# Patient Record
Sex: Female | Born: 1980 | Race: Black or African American | Hispanic: No | Marital: Single | Smoking: Former smoker
Health system: Southern US, Community
[De-identification: ages and names within clinical notes are randomized; demographics above are authoritative.]

## PROBLEM LIST (undated history)

## (undated) DIAGNOSIS — D509 Iron deficiency anemia, unspecified: Secondary | ICD-10-CM

## (undated) DIAGNOSIS — N979 Female infertility, unspecified: Secondary | ICD-10-CM

## (undated) DIAGNOSIS — D473 Essential (hemorrhagic) thrombocythemia: Secondary | ICD-10-CM

## (undated) DIAGNOSIS — N946 Dysmenorrhea, unspecified: Secondary | ICD-10-CM

## (undated) DIAGNOSIS — D75839 Thrombocytosis, unspecified: Secondary | ICD-10-CM

## (undated) HISTORY — DX: Thrombocytosis, unspecified: D75.839

## (undated) HISTORY — DX: Iron deficiency anemia, unspecified: D50.9

## (undated) HISTORY — DX: Essential (hemorrhagic) thrombocythemia: D47.3

## (undated) HISTORY — DX: Dysmenorrhea, unspecified: N94.6

## (undated) HISTORY — DX: Female infertility, unspecified: N97.9

---
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
## 2002-05-17 ENCOUNTER — Emergency Department (HOSPITAL_COMMUNITY): Admission: EM | Admit: 2002-05-17 | Discharge: 2002-05-17 | Payer: Self-pay | Admitting: Emergency Medicine

## 2002-09-07 ENCOUNTER — Encounter: Payer: Self-pay | Admitting: *Deleted

## 2002-09-07 ENCOUNTER — Emergency Department (HOSPITAL_COMMUNITY): Admission: AD | Admit: 2002-09-07 | Discharge: 2002-09-07 | Payer: Self-pay | Admitting: Emergency Medicine

## 2003-02-11 ENCOUNTER — Emergency Department (HOSPITAL_COMMUNITY): Admission: EM | Admit: 2003-02-11 | Discharge: 2003-02-11 | Payer: Self-pay | Admitting: Emergency Medicine

## 2003-04-28 ENCOUNTER — Emergency Department (HOSPITAL_COMMUNITY): Admission: EM | Admit: 2003-04-28 | Discharge: 2003-04-28 | Payer: Self-pay

## 2003-05-02 ENCOUNTER — Emergency Department (HOSPITAL_COMMUNITY): Admission: AD | Admit: 2003-05-02 | Discharge: 2003-05-02 | Payer: Self-pay | Admitting: Family Medicine

## 2004-07-26 ENCOUNTER — Inpatient Hospital Stay (HOSPITAL_COMMUNITY): Admission: AD | Admit: 2004-07-26 | Discharge: 2004-07-26 | Payer: Self-pay | Admitting: Obstetrics and Gynecology

## 2005-01-01 ENCOUNTER — Emergency Department (HOSPITAL_COMMUNITY): Admission: EM | Admit: 2005-01-01 | Discharge: 2005-01-02 | Payer: Self-pay | Admitting: Emergency Medicine

## 2005-10-01 ENCOUNTER — Emergency Department (HOSPITAL_COMMUNITY): Admission: EM | Admit: 2005-10-01 | Discharge: 2005-10-01 | Payer: Self-pay | Admitting: Emergency Medicine

## 2006-02-26 ENCOUNTER — Emergency Department (HOSPITAL_COMMUNITY): Admission: EM | Admit: 2006-02-26 | Discharge: 2006-02-26 | Payer: Self-pay | Admitting: Emergency Medicine

## 2008-02-12 ENCOUNTER — Emergency Department (HOSPITAL_COMMUNITY): Admission: EM | Admit: 2008-02-12 | Discharge: 2008-02-12 | Payer: Self-pay | Admitting: Family Medicine

## 2008-05-22 ENCOUNTER — Emergency Department (HOSPITAL_COMMUNITY): Admission: EM | Admit: 2008-05-22 | Discharge: 2008-05-22 | Payer: Self-pay | Admitting: Emergency Medicine

## 2008-06-25 ENCOUNTER — Emergency Department (HOSPITAL_COMMUNITY): Admission: EM | Admit: 2008-06-25 | Discharge: 2008-06-25 | Payer: Self-pay | Admitting: Emergency Medicine

## 2009-01-31 ENCOUNTER — Emergency Department (HOSPITAL_COMMUNITY): Admission: EM | Admit: 2009-01-31 | Discharge: 2009-01-31 | Payer: Self-pay | Admitting: Emergency Medicine

## 2009-08-12 ENCOUNTER — Emergency Department (HOSPITAL_COMMUNITY): Admission: EM | Admit: 2009-08-12 | Discharge: 2009-08-12 | Payer: Self-pay | Admitting: Emergency Medicine

## 2010-08-13 LAB — URINE CULTURE: Colony Count: 9000

## 2010-08-13 LAB — URINALYSIS, ROUTINE W REFLEX MICROSCOPIC
Bilirubin Urine: NEGATIVE
Glucose, UA: NEGATIVE mg/dL
Hgb urine dipstick: NEGATIVE
Ketones, ur: NEGATIVE mg/dL
Nitrite: NEGATIVE
Protein, ur: NEGATIVE mg/dL
Specific Gravity, Urine: 1.026 (ref 1.005–1.030)
Urobilinogen, UA: 1 mg/dL (ref 0.0–1.0)
pH: 8.5 — ABNORMAL HIGH (ref 5.0–8.0)

## 2010-08-13 LAB — URINE MICROSCOPIC-ADD ON

## 2010-08-13 LAB — POCT I-STAT, CHEM 8
BUN: 11 mg/dL (ref 6–23)
Calcium, Ion: 1.22 mmol/L (ref 1.12–1.32)
Chloride: 107 mEq/L (ref 96–112)
Creatinine, Ser: 0.6 mg/dL (ref 0.4–1.2)
Glucose, Bld: 113 mg/dL — ABNORMAL HIGH (ref 70–99)
HCT: 31 % — ABNORMAL LOW (ref 36.0–46.0)
Hemoglobin: 10.5 g/dL — ABNORMAL LOW (ref 12.0–15.0)
Potassium: 4 mEq/L (ref 3.5–5.1)
Sodium: 139 mEq/L (ref 135–145)
TCO2: 23 mmol/L (ref 0–100)

## 2010-08-13 LAB — POCT PREGNANCY, URINE: Preg Test, Ur: NEGATIVE

## 2010-08-13 LAB — CK: Total CK: 119 U/L (ref 7–177)

## 2010-08-25 LAB — URINALYSIS, ROUTINE W REFLEX MICROSCOPIC
Bilirubin Urine: NEGATIVE
Glucose, UA: NEGATIVE mg/dL
Hgb urine dipstick: NEGATIVE
Ketones, ur: NEGATIVE mg/dL
Nitrite: NEGATIVE
Protein, ur: NEGATIVE mg/dL
Specific Gravity, Urine: 1.026 (ref 1.005–1.030)
Urobilinogen, UA: 1 mg/dL (ref 0.0–1.0)
pH: 6.5 (ref 5.0–8.0)

## 2010-08-25 LAB — BASIC METABOLIC PANEL
BUN: 9 mg/dL (ref 6–23)
CO2: 23 mEq/L (ref 19–32)
Calcium: 9.4 mg/dL (ref 8.4–10.5)
Chloride: 105 mEq/L (ref 96–112)
Creatinine, Ser: 0.59 mg/dL (ref 0.4–1.2)
GFR calc Af Amer: >60 mL/min (ref >60)
GFR calc non Af Amer: >60 mL/min (ref >60)
Glucose, Bld: 84 mg/dL (ref 70–99)
Potassium: 3.7 mEq/L (ref 3.5–5.1)
Sodium: 135 mEq/L (ref 135–145)

## 2010-08-25 LAB — IRON AND TIBC
Iron: 16 ug/dL — ABNORMAL LOW (ref 42–135)
Saturation Ratios: 3 % — ABNORMAL LOW (ref 20–55)
TIBC: 463 ug/dL (ref 250–470)
UIBC: 447 ug/dL

## 2010-08-25 LAB — CBC
HCT: 25.8 % — ABNORMAL LOW (ref 36.0–46.0)
Hemoglobin: 7.8 g/dL — CL (ref 12.0–15.0)
MCHC: 30.4 g/dL (ref 30.0–36.0)
MCV: 68.3 fL — ABNORMAL LOW (ref 78.0–100.0)
Platelets: 641 10*3/uL — ABNORMAL HIGH (ref 150–400)
RBC: 3.77 MIL/uL — ABNORMAL LOW (ref 3.87–5.11)
RDW: 23.6 % — ABNORMAL HIGH (ref 11.5–15.5)
WBC: 11.5 10*3/uL — ABNORMAL HIGH (ref 4.0–10.5)

## 2010-08-25 LAB — RAPID STREP SCREEN (MED CTR MEBANE ONLY): Streptococcus, Group A Screen (Direct): NEGATIVE

## 2010-08-25 LAB — URINE CULTURE: Colony Count: >=100000

## 2010-08-25 LAB — DIFFERENTIAL
Basophils Absolute: 0.1 10*3/uL (ref 0.0–0.1)
Basophils Relative: 1 % (ref 0–1)
Eosinophils Absolute: 0 10*3/uL (ref 0.0–0.7)
Eosinophils Relative: 0 % (ref 0–5)
Lymphocytes Relative: 17 % (ref 12–46)
Lymphs Abs: 2 10*3/uL (ref 0.7–4.0)
Monocytes Absolute: 0.5 10*3/uL (ref 0.1–1.0)
Monocytes Relative: 4 % (ref 3–12)
Neutro Abs: 8.9 10*3/uL — ABNORMAL HIGH (ref 1.7–7.7)
Neutrophils Relative %: 78 % — ABNORMAL HIGH (ref 43–77)

## 2010-08-25 LAB — RETICULOCYTES
RBC.: 4.01 MIL/uL (ref 3.87–5.11)
Retic Count, Absolute: 20.1 10*3/uL (ref 19.0–186.0)
Retic Ct Pct: 0.5 % (ref 0.4–3.1)

## 2010-08-25 LAB — URINE MICROSCOPIC-ADD ON

## 2010-08-25 LAB — FERRITIN: Ferritin: <1 ng/mL — ABNORMAL LOW (ref 10–291)

## 2010-08-25 LAB — FOLATE: Folate: 19.2 ng/mL

## 2010-08-25 LAB — VITAMIN B12: Vitamin B-12: 772 pg/mL (ref 211–911)

## 2012-03-06 ENCOUNTER — Telehealth: Payer: Self-pay | Admitting: Oncology

## 2012-03-06 NOTE — Telephone Encounter (Signed)
S/W pt in re NP appt 10/23 @ 3 w/ Dr. Gaylyn Rong D/T @ pt request.  Referring Dr. Chevis Pretty Dx-[facility] 7.2 plts @ 967 NP packet mailed

## 2012-03-07 ENCOUNTER — Telehealth: Payer: Self-pay | Admitting: Oncology

## 2012-03-07 ENCOUNTER — Encounter: Payer: Self-pay | Admitting: Oncology

## 2012-03-07 DIAGNOSIS — D509 Iron deficiency anemia, unspecified: Secondary | ICD-10-CM | POA: Insufficient documentation

## 2012-03-07 DIAGNOSIS — N946 Dysmenorrhea, unspecified: Secondary | ICD-10-CM | POA: Insufficient documentation

## 2012-03-07 DIAGNOSIS — D473 Essential (hemorrhagic) thrombocythemia: Secondary | ICD-10-CM | POA: Insufficient documentation

## 2012-03-07 NOTE — Telephone Encounter (Signed)
C/D 03/07/12 for appt 03/12/12

## 2012-03-12 ENCOUNTER — Other Ambulatory Visit: Payer: Self-pay | Admitting: Lab

## 2012-03-12 ENCOUNTER — Ambulatory Visit: Payer: Self-pay

## 2012-03-12 ENCOUNTER — Ambulatory Visit: Payer: Self-pay | Admitting: Oncology

## 2012-03-12 NOTE — Progress Notes (Signed)
No show.  Missed appointment letter sent encouraging pt to call to reschedule.

## 2012-04-05 ENCOUNTER — Emergency Department (HOSPITAL_COMMUNITY)
Admission: EM | Admit: 2012-04-05 | Discharge: 2012-04-06 | Disposition: A | Discharge status: Home / Self Care | Payer: BC Managed Care – PPO | Attending: Emergency Medicine | Admitting: Emergency Medicine

## 2012-04-05 ENCOUNTER — Encounter (HOSPITAL_COMMUNITY): Payer: Self-pay | Admitting: Emergency Medicine

## 2012-04-05 DIAGNOSIS — Z862 Personal history of diseases of the blood and blood-forming organs and certain disorders involving the immune mechanism: Secondary | ICD-10-CM | POA: Insufficient documentation

## 2012-04-05 DIAGNOSIS — Z87891 Personal history of nicotine dependence: Secondary | ICD-10-CM | POA: Insufficient documentation

## 2012-04-05 DIAGNOSIS — D649 Anemia, unspecified: Secondary | ICD-10-CM | POA: Insufficient documentation

## 2012-04-05 LAB — POCT I-STAT, CHEM 8
Chloride: 102 mEq/L (ref 96–112)
Creatinine, Ser: 0.8 mg/dL (ref 0.50–1.10)
Hemoglobin: 8.8 g/dL — ABNORMAL LOW (ref 12.0–15.0)
Potassium: 3.5 mEq/L (ref 3.5–5.1)
Sodium: 138 mEq/L (ref 135–145)

## 2012-04-05 NOTE — ED Provider Notes (Signed)
History     CSN: [id number]  Arrival date & time 04/05/12  2249   First MD Initiated Contact with Patient 04/05/12 2307      No chief complaint on file.   (Consider location/radiation/quality/duration/timing/severity/associated sxs/prior treatment) Patient is a 31 y.o. female presenting with musculoskeletal pain. The history is provided by the patient.  Muscle Pain Associated symptoms include weakness. Pertinent negatives include no chills, coughing or fever. Associated symptoms comments: She was seen [facility] yesterday for generalized body aches thinking she had the flu. She states that the flu test was negative. Urgent Care called her today and told her to come to the ED for evaluation of hemoglobin of 7. She reports history of anemia secondary to heavy periods on average of twice monthly. She has no history of requiring transfusion. .    Past Medical History  Diagnosis Date  . Dysmenorrhea   . 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 Iron deficiency anemia   . Thrombocytosis     History reviewed. No pertinent past surgical history.  History reviewed. No pertinent family history.  History  Substance Use Topics  . Smoking status: Former Smoker    Quit date: 03/05/2012  . Smokeless tobacco: Never Used  . Alcohol Use: Yes     Comment: occassionally    OB History    Grav Para Term Preterm Abortions TAB SAB Ect Mult Living                  Review of Systems  Constitutional: Negative for fever and chills.  Respiratory: Negative.  Negative for cough.   Cardiovascular: Negative.   Gastrointestinal: Negative.   Genitourinary: Negative.  Negative for dysuria.  Musculoskeletal: Negative.   Skin: Negative.   Neurological: Positive for weakness. Negative for dizziness and light-headedness.    Allergies  Review of patient's allergies indicates not on file.  Home Medications  No current outpatient prescriptions on file.  BP 101/73  Pulse 84  Temp 98.8 F (37.1 C)  (Oral)  Resp 14  SpO2 99%  LMP 03/21/2012  Physical Exam  Constitutional: She is oriented to person, place, and time. She appears well-developed and well-nourished.  HENT:  Head: Normocephalic.  Eyes:       Mild conjunctival pallor.  Neck: Normal range of motion. Neck supple.  Cardiovascular: Normal rate and regular rhythm.   Pulmonary/Chest: Effort normal and breath sounds normal.  Abdominal: Soft. Bowel sounds are normal. There is no tenderness. There is no rebound and no guarding.  Musculoskeletal: Normal range of motion.  Neurological: She is alert and oriented to person, place, and time.  Skin: Skin is warm and dry. No rash noted.  Psychiatric: She has a normal mood and affect.    ED Course  Procedures (including critical care time)  Labs Reviewed  POCT I-STAT, CHEM 8 - Abnormal; Notable for the following:    Hemoglobin 8.8 (*)     HCT 26.0 (*)     All other components within normal limits   No results found.   No diagnosis found.  1. Anemia   MDM  Hgb is 8.8 without orthostatic hypotension. Patient is stable for discharge and outpatient follow up with Dr. Chevis Pretty.         Rodena Medin, PA-C 04/06/12 0005

## 2012-04-05 NOTE — ED Notes (Signed)
Pt states she thought she had the flu so she went to [facility] yesterday. They gave her shot (pt is unsure of what they gave her). States she is still feeling weak. States her iron was a 7 at the [facility].

## 2012-04-05 NOTE — ED Notes (Signed)
Pt states she has felt weak and tired today. Pt states she has a hx of anemia. Pt states she was aching yesterday. Pt states she has a slight headache. Pt denies nausea.

## 2012-04-06 NOTE — ED Provider Notes (Signed)
Medical screening examination/treatment/procedure(s) were performed by non-physician practitioner and as supervising physician I was immediately available for consultation/collaboration.    Vida Roller, MD 04/06/12 [phone]

## 2012-05-02 ENCOUNTER — Telehealth: Payer: Self-pay | Admitting: Oncology

## 2012-05-02 NOTE — Telephone Encounter (Signed)
S/W PT IN REF TO NP APPT. ON 05/09/12 @3 :00 REFERRING DR Anselmo Rod MAILED NP PACKET

## 2012-05-02 NOTE — Telephone Encounter (Signed)
C/D 05/02/12 for appt. 05/09/12 °

## 2012-05-09 ENCOUNTER — Ambulatory Visit (HOSPITAL_BASED_OUTPATIENT_CLINIC_OR_DEPARTMENT_OTHER): Payer: BC Managed Care – PPO | Admitting: Oncology

## 2012-05-09 ENCOUNTER — Other Ambulatory Visit (HOSPITAL_BASED_OUTPATIENT_CLINIC_OR_DEPARTMENT_OTHER): Payer: BC Managed Care – PPO | Admitting: Lab

## 2012-05-09 ENCOUNTER — Encounter: Payer: Self-pay | Admitting: Oncology

## 2012-05-09 ENCOUNTER — Encounter (HOSPITAL_COMMUNITY)
Admission: RE | Admit: 2012-05-09 | Discharge: 2012-05-09 | Disposition: A | Discharge status: Home / Self Care | Payer: BC Managed Care – PPO | Source: Ambulatory Visit | Attending: Oncology | Admitting: Oncology

## 2012-05-09 ENCOUNTER — Ambulatory Visit: Payer: BC Managed Care – PPO

## 2012-05-09 VITALS — BP 119/78 | HR 83 | Temp 98.0°F | Resp 16 | Ht 61.0 in | Wt 107.0 lb

## 2012-05-09 DIAGNOSIS — D649 Anemia, unspecified: Secondary | ICD-10-CM

## 2012-05-09 DIAGNOSIS — D509 Iron deficiency anemia, unspecified: Secondary | ICD-10-CM

## 2012-05-09 DIAGNOSIS — N920 Excessive and frequent menstruation with regular cycle: Secondary | ICD-10-CM

## 2012-05-09 DIAGNOSIS — N946 Dysmenorrhea, unspecified: Secondary | ICD-10-CM

## 2012-05-09 DIAGNOSIS — D473 Essential (hemorrhagic) thrombocythemia: Secondary | ICD-10-CM

## 2012-05-09 LAB — MORPHOLOGY

## 2012-05-09 LAB — CBC WITH DIFFERENTIAL/PLATELET
Basophils Absolute: 0.1 10*3/uL (ref 0.0–0.1)
Eosinophils Absolute: 0.1 10*3/uL (ref 0.0–0.5)
HCT: 22 % — ABNORMAL LOW (ref 34.8–46.6)
HGB: 6.4 g/dL — CL (ref 11.6–15.9)
MCV: 64 fL — ABNORMAL LOW (ref 79.5–101.0)
MONO%: 6.8 % (ref 0.0–14.0)
NEUT#: 5.2 10*3/uL (ref 1.5–6.5)
RDW: 21.3 % — ABNORMAL HIGH (ref 11.2–14.5)
lymph#: 2 10*3/uL (ref 0.9–3.3)

## 2012-05-09 LAB — COMPREHENSIVE METABOLIC PANEL (CC13)
CO2: 24 mEq/L (ref 22–29)
Creatinine: 0.7 mg/dL (ref 0.6–1.1)
Glucose: 96 mg/dl (ref 70–99)
Total Bilirubin: 0.22 mg/dL (ref 0.20–1.20)

## 2012-05-09 LAB — RETICULOCYTES: Immature Retic Fract: 9.3 % (ref 1.60–10.00)

## 2012-05-09 NOTE — Patient Instructions (Addendum)
1.  Issue:  Severe anemia. 2.  Potential causes:  Iron deficiency (heavy period? Poor absorption from GI track?)   sickle cell/thalessemia; other vitamin deficiency (Vit B12?) 3.  Treatment:   *  Short term:  Blood transfusion:  2 units tomorrow Saturday 05/10/12 at 8am.  *  Long term:  If due to iron deficiency, I will recommend IV iron and oral iron.  I'll call you and let you know what to do.   Figure out cause of anemia and treat underlying causes.  4.  Follow up:  Blood test once a week until improved blood count.  Return visit in about 6 months.

## 2012-05-09 NOTE — Progress Notes (Signed)
[facility]  [phone] [phone]     INITIAL HEMATOLOGY CONSULTATION    Referral MD:  Dr. Dimas Aguas C. Mezer, M.D.  Reason for Referral: anemia.     HPI:  [name] is a 31 year-old [place] American woman with history of menometrorrhagia.  Her menstrual cycle lasts for about 7 days every 21 days.  The first 4 days were heavy when she needs to change pads every 2 hours with lots of clot.  She had history of iron deficiency in the past.  She could not tolerate oral contraceptives and oral iron due to nausea/vomiting, abdominal cramp.  She has not been on oral iron for many years.  For the past few weeks, she has been feeling week.  She has been feeling stress at work where she works as a Presenter, broadcasting at [employer].  She has been having low appetite from what she claimed stress from work.  She has about 10-lb nonintentional weight loss.  She presented to PCP with anemia.  She was kindly referred to the [facility] for evaluation.  [name] presented to the clinic by herself today.  She reported mild fatigue.  She has some numbness in her bilateral arms.  Beside menometrorrhagia, she denied any visible source of bleeding.  She denied dizziness, SOB, chest pain, syncope, skin rash, joint pain.  Patient denies fever, headache, visual changes, confusion, drenching night sweats, palpable lymph node swelling, mucositis, odynophagia, dysphagia, nausea vomiting, jaundice, chest pain, palpitation, shortness of breath, dyspnea on exertion, productive cough, gum bleeding, epistaxis, hematemesis, hemoptysis, abdominal pain, abdominal swelling, early satiety, melena, hematochezia, hematuria, skin rash, spontaneous bleeding, joint swelling, joint pain, heat or cold intolerance, bowel bladder incontinence, back pain, focal motor weakness, paresthesia, depression, suicidal or homicidal ideation, feeling hopelessness.     Past Medical History  Diagnosis Date    . Dysmenorrhea   . 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 Iron deficiency anemia   . Thrombocytosis   :    No past surgical history on file.:   CURRENT MEDS: No current outpatient prescriptions on file.      No Known Allergies:  No family history on file.:  History   Social History  . Marital Status: Single    Spouse Name: N/A    Number of Children: 0  . Years of Education: N/A   Occupational History  .      lifting,ware house.    Social History Main Topics  . Smoking status: Former Smoker    Quit date: 03/05/2012  . Smokeless tobacco: Never Used  . Alcohol Use: Yes     Comment: occassionally  . Drug Use: No  . Sexually Active: Not on file   Other Topics Concern  . Not on file   Social History Narrative  . No narrative on file  :  REVIEW OF SYSTEM:  The rest of the 14-point review of sytem was negative.   Exam: ECOG 0-1.   General:  Thin-appearing woman, in no acute distress.  Eyes:  no scleral icterus.  ENT:  There were no oropharyngeal lesions.  Neck was without thyromegaly.  Lymphatics:  Negative cervical, supraclavicular or axillary adenopathy.  Respiratory: lungs were clear bilaterally without wheezing or crackles.  Cardiovascular:  Regular rate and rhythm, S1/S2, without murmur, rub or gallop.  There was no pedal edema.  GI:  abdomen was soft, flat, nontender, nondistended, without organomegaly.  Despite my explanation of the need for  rectal exam to rule out anal/rectal mass and for heme occult Guaiac testing, she declined.  Muscoloskeletal:  no spinal tenderness of palpation of vertebral spine.  Skin exam was without echymosis, petichae.  Neuro exam was nonfocal.  Patient was able to get on and off exam table without assistance.  Gait was normal.  Patient was alerted and oriented.  Attention was good.   Language was appropriate.  Mood was normal without depression.  Speech was not pressured.  Thought content was not tangential.    LABS:  Lab Results   Component Value Date   WBC 7.8 05/09/2012   HGB 6.4* 05/09/2012   HCT 22.0* 05/09/2012   PLT 217 05/09/2012   GLUCOSE 96 05/09/2012   ALT <6 Repeated and Verified 05/09/2012   AST 14 05/09/2012   NA 136 05/09/2012   K 3.7 05/09/2012   CL 107 05/09/2012   CREATININE 0.7 05/09/2012   BUN 12.0 05/09/2012   CO2 24 05/09/2012    No results found.  Blood smear review:   I personally reviewed the patient's peripheral blood smear today.  There was anisocytosis.  There was no peripheral blast.  There were increased in central pallor, and cigar-shaped RBC.  There was no schistocytosis, spherocytosis, target cell, rouleaux formation, tear drop cell.  There was no giant platelets or platelet clumps.      ASSESSMENT AND PLAN:   1.  Menometrorrhagia:  I deferred to Dr. Corwin Levins expertise to see if she would benefit from other forms of birth control. 2.  Microcytic anemia. - Causes:  Iron deficiency due to menometrorrhagia.  I cannot rule out sickle cell/thalessemia; however, there is no family history of hemoglobinopathy.  I cannot test for these entities while she is severely iron deficiency.  Retic count and rest of LFT did not show sign of hemolysis or brisk bleeding.  - Treatment:   *  Short term:  Blood transfusion:  2 units tomorrow Saturday 05/10/12 at 8am.  *  Long term:  I will recommend IV iron and oral iron.  -  Follow up:  Blood test once a week until improved Hgb.  Return visit in about 6 months.  - If despite iron repletion and she is still anemic, I may consider further work up in the future with hemoglobinopathy testing, bone marrow biopsy, or GI referral.         The length of time of the face-to-face encounter was 45 minutes. More than 50% of time was spent counseling and coordination of care.     Thank you for this referral.

## 2012-05-10 ENCOUNTER — Ambulatory Visit (HOSPITAL_BASED_OUTPATIENT_CLINIC_OR_DEPARTMENT_OTHER): Payer: BC Managed Care – PPO

## 2012-05-10 VITALS — BP 125/76 | HR 76 | Temp 99.0°F | Resp 20

## 2012-05-10 DIAGNOSIS — D649 Anemia, unspecified: Secondary | ICD-10-CM

## 2012-05-10 LAB — IRON AND TIBC: Iron: <10 ug/dL — ABNORMAL LOW (ref 42–145)

## 2012-05-10 LAB — PREPARE RBC (CROSSMATCH)

## 2012-05-10 LAB — FERRITIN: Ferritin: <1 ng/mL — ABNORMAL LOW (ref 10–291)

## 2012-05-10 MED ORDER — DIPHENHYDRAMINE HCL 25 MG PO CAPS
25.0000 mg | ORAL_CAPSULE | Freq: Once | ORAL | Status: AC
  Administered 2012-05-10: 25 mg via ORAL

## 2012-05-10 MED ORDER — ACETAMINOPHEN 325 MG PO TABS
650.0000 mg | ORAL_TABLET | Freq: Once | ORAL | Status: AC
  Administered 2012-05-10: 650 mg via ORAL

## 2012-05-10 MED ORDER — SODIUM CHLORIDE 0.9 % IV SOLN: 250.0000 mL | Freq: Once | INTRAVENOUS | Status: DC

## 2012-05-10 NOTE — Patient Instructions (Addendum)
Blood Transfusion Information WHAT IS A BLOOD TRANSFUSION? A transfusion is the replacement of blood or some of its parts. Blood is made up of multiple cells which provide different functions.  Red blood cells carry oxygen and are used for blood loss replacement.  White blood cells fight against infection.  Platelets control bleeding.  Plasma helps clot blood.  Other blood products are available for specialized needs, such as hemophilia or other clotting disorders. BEFORE THE TRANSFUSION  Who gives blood for transfusions?   You may be able to donate blood to be used at a later date on yourself (autologous donation).  Relatives can be asked to donate blood. This is generally not any safer than if you have received blood from a stranger. The same precautions are taken to ensure safety when a relative's blood is donated.  Healthy volunteers who are fully evaluated to make sure their blood is safe. This is blood bank blood. Transfusion therapy is the safest it has ever been in the practice of medicine. Before blood is taken from a donor, a complete history is taken to make sure that person has no history of diseases nor engages in risky social behavior (examples are intravenous drug use or sexual activity with multiple partners). The donor's travel history is screened to minimize risk of transmitting infections, such as malaria. The donated blood is tested for signs of infectious diseases, such as HIV and hepatitis. The blood is then tested to be sure it is compatible with you in order to minimize the chance of a transfusion reaction. If you or a relative donates blood, this is often done in anticipation of surgery and is not appropriate for emergency situations. It takes many days to process the donated blood. RISKS AND COMPLICATIONS Although transfusion therapy is very safe and saves many lives, the main dangers of transfusion include:   Getting an infectious disease.  Developing a  transfusion reaction. This is an allergic reaction to something in the blood you were given. Every precaution is taken to prevent this. The decision to have a blood transfusion has been considered carefully by your caregiver before blood is given. Blood is not given unless the benefits outweigh the risks. AFTER THE TRANSFUSION  Right after receiving a blood transfusion, you will usually feel much better and more energetic. This is especially true if your red blood cells have gotten low (anemic). The transfusion raises the level of the red blood cells which carry oxygen, and this usually causes an energy increase.  The nurse administering the transfusion will monitor you carefully for complications. HOME CARE INSTRUCTIONS  No special instructions are needed after a transfusion. You may find your energy is better. Speak with your caregiver about any limitations on activity for underlying diseases you may have. SEEK MEDICAL CARE IF:   Your condition is not improving after your transfusion.  You develop redness or irritation at the intravenous (IV) site. SEEK IMMEDIATE MEDICAL CARE IF:  Any of the following symptoms occur over the next 12 hours:  Shaking chills.  You have a temperature by mouth above 102 F (38.9 C), not controlled by medicine.  Chest, back, or muscle pain.  People around you feel you are not acting correctly or are confused.  Shortness of breath or difficulty breathing.  Dizziness and fainting.  You get a rash or develop hives.  You have a decrease in urine output.  Your urine turns a dark color or changes to pink, red, or brown. Any of the following   symptoms occur over the next 10 days:  You have a temperature by mouth above 102 F (38.9 C), not controlled by medicine.  Shortness of breath.  Weakness after normal activity.  The white part of the eye turns yellow (jaundice).  You have a decrease in the amount of urine or are urinating less often.  Your  urine turns a dark color or changes to pink, red, or brown. Document Released: 05/04/2000 Document Revised: 07/30/2011 Document Reviewed: 12/22/2007 ExitCare Patient Information 2013 [employer].  

## 2012-05-11 LAB — TYPE AND SCREEN
Antibody Screen: NEGATIVE
Unit division: 0

## 2012-05-16 ENCOUNTER — Other Ambulatory Visit (HOSPITAL_BASED_OUTPATIENT_CLINIC_OR_DEPARTMENT_OTHER): Payer: BC Managed Care – PPO

## 2012-05-16 DIAGNOSIS — D509 Iron deficiency anemia, unspecified: Secondary | ICD-10-CM

## 2012-05-16 DIAGNOSIS — D649 Anemia, unspecified: Secondary | ICD-10-CM

## 2012-05-16 LAB — CBC WITH DIFFERENTIAL/PLATELET
Basophils Absolute: 0.1 10*3/uL (ref 0.0–0.1)
Eosinophils Absolute: 0.2 10*3/uL (ref 0.0–0.5)
HGB: 9.7 g/dL — ABNORMAL LOW (ref 11.6–15.9)
LYMPH%: 25.3 % (ref 14.0–49.7)
MCV: 71.7 fL — ABNORMAL LOW (ref 79.5–101.0)
MONO#: 0.6 10*3/uL (ref 0.1–0.9)
MONO%: 5.8 % (ref 0.0–14.0)
NEUT%: 66.2 % (ref 38.4–76.8)

## 2012-05-17 ENCOUNTER — Telehealth: Payer: Self-pay | Admitting: *Deleted

## 2012-05-17 NOTE — Telephone Encounter (Signed)
Per staff message and POF I have scheduled appt.  JMW  

## 2012-05-17 NOTE — Telephone Encounter (Signed)
Per staff message and POF I have scheduled appts.  JMW  

## 2012-05-19 ENCOUNTER — Telehealth: Payer: Self-pay | Admitting: Oncology

## 2012-05-19 NOTE — Telephone Encounter (Signed)
lvm for pt for 12.31.13 tx...Marland KitchenMarland Kitchen

## 2012-05-20 ENCOUNTER — Ambulatory Visit (HOSPITAL_BASED_OUTPATIENT_CLINIC_OR_DEPARTMENT_OTHER): Payer: BC Managed Care – PPO

## 2012-05-20 VITALS — BP 124/79 | HR 70 | Temp 98.5°F

## 2012-05-20 DIAGNOSIS — D649 Anemia, unspecified: Secondary | ICD-10-CM

## 2012-05-20 DIAGNOSIS — D509 Iron deficiency anemia, unspecified: Secondary | ICD-10-CM

## 2012-05-20 MED ORDER — FERUMOXYTOL INJECTION 510 MG/17 ML
1020.0000 mg | Freq: Once | INTRAVENOUS | Status: AC
  Administered 2012-05-20: 1020 mg via INTRAVENOUS
  Filled 2012-05-20: qty 34

## 2012-05-20 MED ORDER — SODIUM CHLORIDE 0.9 % IV SOLN
INTRAVENOUS | Status: DC
  Administered 2012-05-20: 16:00:00 via INTRAVENOUS

## 2012-05-20 NOTE — Patient Instructions (Signed)
Ferumoxytol injection What is this medicine? FERUMOXYTOL is an iron complex. Iron is used to make healthy red blood cells, which carry oxygen and nutrients throughout the body. This medicine is used to treat iron deficiency anemia in people with chronic kidney disease. This medicine may be used for other purposes; ask your health care provider or pharmacist if you have questions. What should I tell my health care provider before I take this medicine? They need to know if you have any of these conditions: -anemia not caused by low iron levels -high levels of iron in the blood -magnetic resonance imaging (MRI) test scheduled -an unusual or allergic reaction to iron, other medicines, foods, dyes, or preservatives -pregnant or trying to get pregnant -breast-feeding How should I use this medicine? This medicine is for infusion into a vein. It is given by a health care professional in a hospital or clinic setting. Talk to your pediatrician regarding the use of this medicine in children. Special care may be needed. Overdosage: If you think you've taken too much of this medicine contact a poison control center or emergency room at once. Overdosage: If you think you have taken too much of this medicine contact a poison control center or emergency room at once. NOTE: This medicine is only for you. Do not share this medicine with others. What if I miss a dose? It is important not to miss your dose. Call your doctor or health care professional if you are unable to keep an appointment. What may interact with this medicine? This medicine may interact with the following medications: -other iron products This list may not describe all possible interactions. Give your health care provider a list of all the medicines, herbs, non-prescription drugs, or dietary supplements you use. Also tell them if you smoke, drink alcohol, or use illegal drugs. Some items may interact with your medicine. What should I watch  for while using this medicine? Visit your doctor or healthcare professional regularly. Tell your doctor or healthcare professional if your symptoms do not start to get better or if they get worse. You may need blood work done while you are taking this medicine. You may need to follow a special diet. Talk to your doctor. Foods that contain iron include: whole grains/cereals, dried fruits, beans, or peas, leafy green vegetables, and organ meats (liver, kidney). What side effects may I notice from receiving this medicine? Side effects that you should report to your doctor or health care professional as soon as possible: -allergic reactions like skin rash, itching or hives, swelling of the face, lips, or tongue -breathing problems -changes in blood pressure -feeling faint or lightheaded, falls -fever or chills -flushing, sweating, or hot feelings -swelling of the ankles or feet Side effects that usually do not require medical attention (Report these to your doctor or health care professional if they continue or are bothersome.): -diarrhea -headache -nausea, vomiting -stomach pain This list may not describe all possible side effects. Call your doctor for medical advice about side effects. You may report side effects to FDA at 1-800-FDA-1088. Where should I keep my medicine? This drug is given in a hospital or clinic and will not be stored at home. NOTE: This sheet is a summary. It may not cover all possible information. If you have questions about this medicine, talk to your doctor, pharmacist, or health care provider.  2013, Elsevier/[employer] Standard. (01/28/2008 9:48:25 PM)  

## 2012-05-23 ENCOUNTER — Other Ambulatory Visit (HOSPITAL_BASED_OUTPATIENT_CLINIC_OR_DEPARTMENT_OTHER): Payer: BC Managed Care – PPO | Admitting: Lab

## 2012-05-23 ENCOUNTER — Telehealth: Payer: Self-pay | Admitting: *Deleted

## 2012-05-23 DIAGNOSIS — D509 Iron deficiency anemia, unspecified: Secondary | ICD-10-CM

## 2012-05-23 DIAGNOSIS — D649 Anemia, unspecified: Secondary | ICD-10-CM

## 2012-05-23 LAB — CBC WITH DIFFERENTIAL/PLATELET
BASO%: 1.3 % (ref 0.0–2.0)
HCT: 31.4 % — ABNORMAL LOW (ref 34.8–46.6)
LYMPH%: 21.8 % (ref 14.0–49.7)
MCH: 22.4 pg — ABNORMAL LOW (ref 25.1–34.0)
MCHC: 31.4 g/dL — ABNORMAL LOW (ref 31.5–36.0)
MCV: 71.3 fL — ABNORMAL LOW (ref 79.5–101.0)
MONO#: 0.7 10*3/uL (ref 0.1–0.9)
MONO%: 6.2 % (ref 0.0–14.0)
NEUT%: 70 % (ref 38.4–76.8)
Platelets: 513 10*3/uL — ABNORMAL HIGH (ref 145–400)
RBC: 4.4 10*6/uL (ref 3.70–5.45)
WBC: 11.9 10*3/uL — ABNORMAL HIGH (ref 3.9–10.3)

## 2012-05-23 NOTE — Telephone Encounter (Signed)
Left VM for pt to return nurse's call.  

## 2012-05-23 NOTE — Telephone Encounter (Signed)
Message copied by Wende Mott on Fri May 23, 2012  4:36 PM ------      Message from: Jethro Bolus T      Created: Fri May 23, 2012  4:23 PM       Please call pt.  Hgb is 9.8.  We can check CBC every 2 weeks now.  She received IV iron 05/20/12.  Hopefully, this will kick in by the end of Jan 2014 to improve her Hgb much more.  Thanks.

## 2012-05-26 ENCOUNTER — Telehealth: Payer: Self-pay | Admitting: Oncology

## 2012-05-26 NOTE — Telephone Encounter (Signed)
s.w. pt and advised on change in schedule and to come by on nxt visit to get updated schedule....pt aware and ok

## 2012-05-30 ENCOUNTER — Other Ambulatory Visit: Payer: BC Managed Care – PPO

## 2012-06-06 ENCOUNTER — Other Ambulatory Visit: Payer: BC Managed Care – PPO | Admitting: Lab

## 2012-06-13 ENCOUNTER — Other Ambulatory Visit: Payer: BC Managed Care – PPO

## 2012-06-20 ENCOUNTER — Other Ambulatory Visit: Payer: BC Managed Care – PPO | Admitting: Lab

## 2012-06-20 ENCOUNTER — Other Ambulatory Visit: Payer: BC Managed Care – PPO

## 2012-06-27 ENCOUNTER — Other Ambulatory Visit: Payer: BC Managed Care – PPO

## 2012-07-04 ENCOUNTER — Other Ambulatory Visit: Payer: BC Managed Care – PPO | Admitting: Lab

## 2012-07-04 ENCOUNTER — Other Ambulatory Visit: Payer: BC Managed Care – PPO

## 2012-07-11 ENCOUNTER — Other Ambulatory Visit: Payer: BC Managed Care – PPO

## 2012-07-18 ENCOUNTER — Other Ambulatory Visit: Payer: BC Managed Care – PPO

## 2012-07-25 ENCOUNTER — Other Ambulatory Visit: Payer: BC Managed Care – PPO

## 2012-10-20 ENCOUNTER — Telehealth: Payer: Self-pay | Admitting: Oncology

## 2012-11-07 ENCOUNTER — Other Ambulatory Visit: Payer: BC Managed Care – PPO | Admitting: Lab

## 2012-11-07 ENCOUNTER — Ambulatory Visit: Payer: BC Managed Care – PPO | Admitting: Oncology

## 2012-11-10 ENCOUNTER — Telehealth: Payer: Self-pay | Admitting: Oncology

## 2012-11-10 NOTE — Telephone Encounter (Signed)
, °

## 2012-11-11 ENCOUNTER — Ambulatory Visit (HOSPITAL_BASED_OUTPATIENT_CLINIC_OR_DEPARTMENT_OTHER): Payer: BC Managed Care – PPO | Admitting: Oncology

## 2012-11-11 ENCOUNTER — Other Ambulatory Visit (HOSPITAL_BASED_OUTPATIENT_CLINIC_OR_DEPARTMENT_OTHER): Payer: BC Managed Care – PPO | Admitting: Lab

## 2012-11-11 ENCOUNTER — Telehealth: Payer: Self-pay | Admitting: Oncology

## 2012-11-11 ENCOUNTER — Encounter: Payer: Self-pay | Admitting: Oncology

## 2012-11-11 VITALS — BP 123/83 | HR 67 | Temp 98.2°F | Resp 18 | Ht 61.0 in | Wt 110.6 lb

## 2012-11-11 DIAGNOSIS — D649 Anemia, unspecified: Secondary | ICD-10-CM

## 2012-11-11 DIAGNOSIS — D509 Iron deficiency anemia, unspecified: Secondary | ICD-10-CM

## 2012-11-11 LAB — CBC WITH DIFFERENTIAL/PLATELET
BASO%: 0.8 % (ref 0.0–2.0)
Basophils Absolute: 0.1 10*3/uL (ref 0.0–0.1)
EOS%: 0.6 % (ref 0.0–7.0)
HCT: 35 % (ref 34.8–46.6)
HGB: 12.1 g/dL (ref 11.6–15.9)
MCH: 30.5 pg (ref 25.1–34.0)
MCHC: 34.5 g/dL (ref 31.5–36.0)
MCV: 88.3 fL (ref 79.5–101.0)
MONO%: 6.3 % (ref 0.0–14.0)
NEUT%: 72.6 % (ref 38.4–76.8)
lymph#: 2.2 10*3/uL (ref 0.9–3.3)

## 2012-11-11 NOTE — Telephone Encounter (Signed)
gv and printed appt sched and avs for pt  °

## 2012-11-11 NOTE — Patient Instructions (Addendum)
° °  Iron-Rich Diet ° °An iron-rich diet contains foods that are good sources of iron. Iron is an important mineral that helps your body produce hemoglobin. Hemoglobin is a protein in red blood cells that carries oxygen to the body's tissues. Sometimes, the iron level in your blood can be low. This may be caused by: °· A lack of iron in your diet. °· Blood loss. °· Times of growth, such as during pregnancy or during a child's growth and development. °Low levels of iron can cause a decrease in the number of red blood cells. This can result in iron deficiency anemia. Iron deficiency anemia symptoms include: °· Tiredness. °· Weakness. °· Irritability. °· Increased chance of infection. °Here are some recommendations for daily iron intake: °· Males older than 31 years of age need 8 mg of iron per day. °· Women ages 19 to 50 need 18 mg of iron per day. °· Pregnant women need 27 mg of iron per day, and women who are over 19 years of age and breastfeeding need 9 mg of iron per day. °· Women over the age of 50 need 8 mg of iron per day. °SOURCES OF IRON °There are 2 types of iron that are found in food: heme iron and nonheme iron. Heme iron is absorbed by the body better than nonheme iron. Heme iron is found in meat, poultry, and fish. Nonheme iron is found in grains, beans, and vegetables. °Heme Iron Sources °Food / Iron (mg) °· Chicken liver, 3 oz (85 g)/ 10 mg °· Beef liver, 3 oz (85 g)/ 5.5 mg °· Oysters, 3 oz (85 g)/ 8 mg °· Beef, 3 oz (85 g)/ 2 to 3 mg °· Shrimp, 3 oz (85 g)/ 2.8 mg °· Turkey, 3 oz (85 g)/ 2 mg °· Chicken, 3 oz (85 g) / 1 mg °· Fish (tuna, halibut), 3 oz (85 g)/ 1 mg °· Pork, 3 oz (85 g)/ 0.9 mg °Nonheme Iron Sources °Food / Iron (mg) °· Ready-to-eat breakfast cereal, iron-fortified / 3.9 to 7 mg °· Tofu, ½ cup / 3.4 mg °· Kidney beans, ½ cup / 2.6 mg °· Baked potato with skin / 2.7 mg °· Asparagus, ½ cup / 2.2 mg °· Avocado / 2 mg °· Dried peaches, ½ cup / 1.6 mg °· Raisins, ½ cup / 1.5 mg °· Soy milk,  1 cup / 1.5 mg °· Whole-wheat bread, 1 slice / 1.2 mg °· Spinach, 1 cup / 0.8 mg °· Broccoli, ½ cup / 0.6 mg °IRON ABSORPTION °Certain foods can decrease the body's absorption of iron. Try to avoid these foods and beverages while eating meals with iron-containing foods: °· Coffee. °· Tea. °· Fiber. °· Soy. °Foods containing vitamin C can help increase the amount of iron your body absorbs from iron sources, especially from nonheme sources. Eat foods with vitamin C along with iron-containing foods to increase your iron absorption. Foods that are high in vitamin C include many fruits and vegetables. Some good sources are: °· Fresh orange juice. °· Oranges. °· Strawberries. °· Mangoes. °· Grapefruit. °· Red bell peppers. °· Green bell peppers. °· Broccoli. °· Potatoes with skin. °· Tomato juice. °Document Released: 12/19/2004 Document Revised: 07/30/2011 Document Reviewed: 10/26/2010 °ExitCare® Patient Information ©2014 [employer]. ° °

## 2012-11-11 NOTE — Progress Notes (Signed)
[facility]  [phone] [phone]   OFFICE PROGRESS NOTE   Cc:  [name], MD  DIAGNOSIS: Iron deficiency anemia  PAST THERAPY: Status post 2 units packed red blood cells on 05/10/2012. Status post Feraheme 1020 mg IV on 05/20/2012.  CURRENT THERAPY: Watchful observation  INTERVAL HISTORY: Helen Ford 32 y.o. female returns for routine followup by herself. Reports that her fatigue is much better since receiving a blood transfusion IV iron. She denies chest pain, shortness of breath, dyspnea on exertion. Denies abdominal pain, nausea, vomiting. Reports that her periods are not as heavy as he used to be. However, states that she has periods about every 21 days. She denies any other visible source of bleeding. No dizziness or syncope.  Past Medical History  Diagnosis Date  . Dysmenorrhea   . 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 Iron deficiency anemia   . Thrombocytosis     History reviewed. No pertinent past surgical history.  No current outpatient prescriptions on file.   No current facility-administered medications for this visit.    ALLERGIES:  has No Known Allergies.  REVIEW OF SYSTEMS:  The rest of the 14-point review of system was negative.   Filed Vitals:   11/11/12 1515  BP: 123/83  Pulse: 67  Temp: 98.2 F (36.8 C)  Resp: 18   Wt Readings from Last 3 Encounters:  11/11/12 110 lb 9.6 oz (50.168 kg)  05/09/12 107 lb (48.535 kg)   ECOG Performance status: 0  PHYSICAL EXAMINATION:   General:  well-nourished in no acute distress.  Eyes:  no scleral icterus.  ENT:  There were no oropharyngeal lesions.  Neck was without thyromegaly.  Lymphatics:  Negative cervical, supraclavicular or axillary adenopathy.  Respiratory: lungs were clear bilaterally without wheezing or crackles.  Cardiovascular:  Regular rate and rhythm, S1/S2, without murmur, rub or gallop.  There was no pedal edema.  GI:  abdomen was soft, flat,  nontender, nondistended, without organomegaly.  Muscoloskeletal:  no spinal tenderness of palpation of vertebral spine.  Skin exam was without echymosis, petichae.  Neuro exam was nonfocal.  Patient was able to get on and off exam table without assistance.  Gait was normal.  Patient was alerted and oriented.  Attention was good.   Language was appropriate.  Mood was normal without depression.  Speech was not pressured.  Thought content was not tangential.    LABORATORY/RADIOLOGY DATA:  Lab Results  Component Value Date   WBC 11.1* 11/11/2012   HGB 12.1 11/11/2012   HCT 35.0 11/11/2012   PLT 306 11/11/2012   GLUCOSE 96 05/09/2012   ALKPHOS 57 05/09/2012   ALT <6 Repeated and Verified 05/09/2012   AST 14 05/09/2012   NA 136 05/09/2012   K 3.7 05/09/2012   CL 107 05/09/2012   CREATININE 0.7 05/09/2012   BUN 12.0 05/09/2012   CO2 24 05/09/2012    ASSESSMENT AND PLAN:   1. Menometrorrhagia: Improved without intervention.  2. Microcytic anemia.  - Causes: Iron deficiency due to menometrorrhagia. I cannot rule out sickle cell/thalessemia; however, there is no family history of hemoglobinopathy.  - Treatment: She is status post blood and IV iron. Hemoglobin has now normalized. She does not tolerate oral iron due to abdominal cramping. I have given her information regarding diets rich in iron. - Follow up: CBC in 3 months. Return visit in about 6 months. We will replace the IV iron as needed.    The length of  time of the face-to-face encounter was 15 minutes. More than 50% of time was spent counseling and coordination of care.

## 2013-01-28 ENCOUNTER — Telehealth: Payer: Self-pay | Admitting: *Deleted

## 2013-01-28 NOTE — Telephone Encounter (Signed)
Lm informed the pt that the doctor will be out of the office on 04/28/13. gv appt for 04/29/13 w/ labs@9 :30am and ov @ 11am. i will mail a letter/avs...td

## 2013-02-03 ENCOUNTER — Other Ambulatory Visit (HOSPITAL_BASED_OUTPATIENT_CLINIC_OR_DEPARTMENT_OTHER): Payer: BC Managed Care – PPO

## 2013-02-03 DIAGNOSIS — D509 Iron deficiency anemia, unspecified: Secondary | ICD-10-CM

## 2013-02-03 LAB — CBC WITH DIFFERENTIAL/PLATELET
BASO%: 1.2 % (ref 0.0–2.0)
HCT: 31.6 % — ABNORMAL LOW (ref 34.8–46.6)
LYMPH%: 22.8 % (ref 14.0–49.7)
MCHC: 32.9 g/dL (ref 31.5–36.0)
MCV: 81.9 fL (ref 79.5–101.0)
MONO#: 0.7 10*3/uL (ref 0.1–0.9)
MONO%: 7.1 % (ref 0.0–14.0)
NEUT%: 67.9 % (ref 38.4–76.8)
Platelets: 359 10*3/uL (ref 145–400)
WBC: 9.6 10*3/uL (ref 3.9–10.3)

## 2013-02-17 ENCOUNTER — Other Ambulatory Visit: Payer: Self-pay | Admitting: Internal Medicine

## 2013-02-17 DIAGNOSIS — D509 Iron deficiency anemia, unspecified: Secondary | ICD-10-CM

## 2013-02-18 ENCOUNTER — Telehealth: Payer: Self-pay | Admitting: *Deleted

## 2013-02-18 ENCOUNTER — Telehealth: Payer: Self-pay | Admitting: Internal Medicine

## 2013-02-18 NOTE — Telephone Encounter (Signed)
lvm for pt will info for all OCT and Dec appts.

## 2013-02-18 NOTE — Telephone Encounter (Signed)
Per staff message and POF I have scheduled appts.  JMW  

## 2013-02-20 ENCOUNTER — Ambulatory Visit (HOSPITAL_BASED_OUTPATIENT_CLINIC_OR_DEPARTMENT_OTHER): Payer: BC Managed Care – PPO

## 2013-02-20 VITALS — BP 128/82 | HR 91 | Temp 98.5°F

## 2013-02-20 DIAGNOSIS — D509 Iron deficiency anemia, unspecified: Secondary | ICD-10-CM

## 2013-02-20 DIAGNOSIS — N946 Dysmenorrhea, unspecified: Secondary | ICD-10-CM

## 2013-02-20 MED ORDER — SODIUM CHLORIDE 0.9 % IV SOLN
Freq: Once | INTRAVENOUS | Status: AC
  Administered 2013-02-20: 11:00:00 via INTRAVENOUS

## 2013-02-20 MED ORDER — SODIUM CHLORIDE 0.9 % IV SOLN
1020.0000 mg | Freq: Once | INTRAVENOUS | Status: AC
  Administered 2013-02-20: 1020 mg via INTRAVENOUS
  Filled 2013-02-20: qty 34

## 2013-02-20 NOTE — Patient Instructions (Addendum)
Ferumoxytol injection What is this medicine? FERUMOXYTOL is an iron complex. Iron is used to make healthy red blood cells, which carry oxygen and nutrients throughout the body. This medicine is used to treat iron deficiency anemia in people with chronic kidney disease. This medicine may be used for other purposes; ask your health care provider or pharmacist if you have questions. What should I tell my health care provider before I take this medicine? They need to know if you have any of these conditions: -anemia not caused by low iron levels -high levels of iron in the blood -magnetic resonance imaging (MRI) test scheduled -an unusual or allergic reaction to iron, other medicines, foods, dyes, or preservatives -pregnant or trying to get pregnant -breast-feeding How should I use this medicine? This medicine is for infusion into a vein. It is given by a health care professional in a hospital or clinic setting. Talk to your pediatrician regarding the use of this medicine in children. Special care may be needed. Overdosage: If you think you've taken too much of this medicine contact a poison control center or emergency room at once. Overdosage: If you think you have taken too much of this medicine contact a poison control center or emergency room at once. NOTE: This medicine is only for you. Do not share this medicine with others. What if I miss a dose? It is important not to miss your dose. Call your doctor or health care professional if you are unable to keep an appointment. What may interact with this medicine? This medicine may interact with the following medications: -other iron products This list may not describe all possible interactions. Give your health care provider a list of all the medicines, herbs, non-prescription drugs, or dietary supplements you use. Also tell them if you smoke, drink alcohol, or use illegal drugs. Some items may interact with your medicine. What should I watch  for while using this medicine? Visit your doctor or healthcare professional regularly. Tell your doctor or healthcare professional if your symptoms do not start to get better or if they get worse. You may need blood work done while you are taking this medicine. You may need to follow a special diet. Talk to your doctor. Foods that contain iron include: whole grains/cereals, dried fruits, beans, or peas, leafy green vegetables, and organ meats (liver, kidney). What side effects may I notice from receiving this medicine? Side effects that you should report to your doctor or health care professional as soon as possible: -allergic reactions like skin rash, itching or hives, swelling of the face, lips, or tongue -breathing problems -changes in blood pressure -feeling faint or lightheaded, falls -fever or chills -flushing, sweating, or hot feelings -swelling of the ankles or feet Side effects that usually do not require medical attention (Report these to your doctor or health care professional if they continue or are bothersome.): -diarrhea -headache -nausea, vomiting -stomach pain This list may not describe all possible side effects. Call your doctor for medical advice about side effects. You may report side effects to FDA at 1-800-FDA-1088. Where should I keep my medicine? This drug is given in a hospital or clinic and will not be stored at home. NOTE: This sheet is a summary. It may not cover all possible information. If you have questions about this medicine, talk to your doctor, pharmacist, or health care provider.  2013, [employer]vier/[employer] Standard. (01/28/2008 9:48:25 PM)  

## 2013-02-20 NOTE — Progress Notes (Signed)
Pt given a note for work stating that she had been in our office today for an appointment.

## 2013-03-17 ENCOUNTER — Other Ambulatory Visit (HOSPITAL_BASED_OUTPATIENT_CLINIC_OR_DEPARTMENT_OTHER): Payer: BC Managed Care – PPO | Admitting: Lab

## 2013-03-17 DIAGNOSIS — D509 Iron deficiency anemia, unspecified: Secondary | ICD-10-CM

## 2013-03-17 LAB — CBC WITH DIFFERENTIAL/PLATELET
Basophils Absolute: 0.1 10*3/uL (ref 0.0–0.1)
EOS%: 0.6 % (ref 0.0–7.0)
HCT: 36.2 % (ref 34.8–46.6)
HGB: 11.6 g/dL (ref 11.6–15.9)
MCH: 27.7 pg (ref 25.1–34.0)
MCV: 86.4 fL (ref 79.5–101.0)
MONO%: 5.6 % (ref 0.0–14.0)
NEUT%: 75.7 % (ref 38.4–76.8)

## 2013-03-17 LAB — IRON AND TIBC CHCC
Iron: 86 ug/dL (ref 41–142)
TIBC: 258 ug/dL (ref 236–444)

## 2013-04-28 ENCOUNTER — Ambulatory Visit: Payer: BC Managed Care – PPO

## 2013-04-28 ENCOUNTER — Other Ambulatory Visit: Payer: BC Managed Care – PPO | Admitting: Lab

## 2013-04-28 ENCOUNTER — Other Ambulatory Visit: Payer: Self-pay

## 2013-04-28 DIAGNOSIS — D509 Iron deficiency anemia, unspecified: Secondary | ICD-10-CM

## 2013-04-29 ENCOUNTER — Ambulatory Visit (HOSPITAL_BASED_OUTPATIENT_CLINIC_OR_DEPARTMENT_OTHER): Payer: BC Managed Care – PPO | Admitting: Internal Medicine

## 2013-04-29 ENCOUNTER — Other Ambulatory Visit (HOSPITAL_BASED_OUTPATIENT_CLINIC_OR_DEPARTMENT_OTHER): Payer: BC Managed Care – PPO

## 2013-04-29 VITALS — BP 117/83 | HR 63 | Temp 97.0°F | Resp 18 | Ht 61.0 in | Wt 112.7 lb

## 2013-04-29 DIAGNOSIS — D509 Iron deficiency anemia, unspecified: Secondary | ICD-10-CM

## 2013-04-29 DIAGNOSIS — N920 Excessive and frequent menstruation with regular cycle: Secondary | ICD-10-CM

## 2013-04-29 LAB — CBC WITH DIFFERENTIAL/PLATELET
BASO%: 0.7 % (ref 0.0–2.0)
EOS%: 1.2 % (ref 0.0–7.0)
Eosinophils Absolute: 0.1 10*3/uL (ref 0.0–0.5)
HCT: 40.9 % (ref 34.8–46.6)
LYMPH%: 17 % (ref 14.0–49.7)
MCH: 29.7 pg (ref 25.1–34.0)
MCV: 89 fL (ref 79.5–101.0)
MONO#: 0.5 10*3/uL (ref 0.1–0.9)
MONO%: 5.4 % (ref 0.0–14.0)
NEUT#: 6.5 10*3/uL (ref 1.5–6.5)
NEUT%: 75.7 % (ref 38.4–76.8)
Platelets: 305 10*3/uL (ref 145–400)
RBC: 4.6 10*6/uL (ref 3.70–5.45)
WBC: 8.6 10*3/uL (ref 3.9–10.3)

## 2013-04-29 LAB — IRON AND TIBC CHCC
%SAT: 37 % (ref 21–57)
UIBC: 159 ug/dL (ref 120–384)

## 2013-04-29 NOTE — Patient Instructions (Signed)

## 2013-04-29 NOTE — Progress Notes (Signed)
[facility] OFFICE PROGRESS NOTE  [name], MD [address], [address] [place] [address]  DIAGNOSIS: Iron deficiency anemia - Plan: CBC with Differential, CBC with Differential, Comprehensive metabolic panel, Ferritin, Ferritin, Iron and TIBC, Iron and TIBC  Chief Complaint  Patient presents with  . Iron deficiency anemia    CURRENT THERAPY: Feraheme 1,020 mg.  She received a dose on 05/20/2012 and on 02/20/2013.   INTERVAL HISTORY: Helen Ford 32 y.o. female with a history of iron-deficiency anemia secondary to dysmenorrhea is here for follow-up.  She was last seen by NP Myrtis Ser on 11/11/2012. [name] presented to the clinic by herself today.  She received feraheme 1,020 mg on 02/20/2013 with improvement in her symptoms, mainly fatigue.  She denies chest pain, shortness of breath, dyspnea on exertion.  She denies abdominal pain, nausea or vomiting.  Her last menstrual period was on 04/04/2013 lasting seven days.  She has a check up with gynecology in the new few weeks.  She denies emergency room visits or hospitalizations.  She denies picca.   Of note, on her initial consultation with Dr. Gaylyn Rong on 05/09/2012 she reported a history of menometrorrhagia. Her menstrual cycle lasts for about 7 days every 21 days. The first 4 days were heavy when she needed to change pads every 2 hours with lots of clot. She had history of iron deficiency in the past. She could not tolerate oral contraceptives and oral iron due to nausea/vomiting, abdominal cramp. She has not been on oral iron for many years. For the past few weeks, she has been feeling week. 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 She has been having low appetite from what she claimed stress from work. She has about 10-lb nonintentional weight loss. She presented to PCP with anemia. She received iron on 05/20/2012.   MEDICAL HISTORY: Past Medical History   Diagnosis Date  . Dysmenorrhea   . 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 Iron deficiency anemia   . Thrombocytosis    INTERIM HISTORY: has Iron deficiency anemia; Thrombocytosis; and Dysmenorrhea on her problem list.    ALLERGIES:  has No Known Allergies.  MEDICATIONS: has a current medication list which includes the following prescription(s): multivitamin.  SURGICAL HISTORY: No past surgical history on file.  REVIEW OF SYSTEMS:   Constitutional: Denies fevers, chills or abnormal weight loss Eyes: Denies blurriness of vision Ears, nose, mouth, throat, and face: Denies mucositis or sore throat Respiratory: Denies cough, dyspnea or wheezes Cardiovascular: Denies palpitation, chest discomfort or lower extremity swelling Gastrointestinal:  Denies nausea, heartburn or change in bowel habits Skin: Denies abnormal skin rashes Lymphatics: Denies new lymphadenopathy or easy bruising Neurological:Denies numbness, tingling or new weaknesses Behavioral/Psych: Mood is stable, no new changes  All other systems were reviewed with the patient and are negative.  PHYSICAL EXAMINATION: ECOG PERFORMANCE STATUS: 0 - Asymptomatic  Blood pressure 117/83, pulse 63, temperature 97 F (36.1 C), temperature source Oral, resp. rate 18, height 5\' 1"  (1.549 m), weight 112 lb 11.2 oz (51.12 kg).  GENERAL:alert, no distress and comfortable; well developed, well noursished.  SKIN: skin color, texture, turgor are normal, no rashes or significant lesions EYES: normal, Conjunctiva are pink and non-injected, sclera clear OROPHARYNX:no exudate, no erythema and lips, buccal mucosa, and tongue normal  NECK: supple, thyroid normal size, non-tender, without nodularity LYMPH:  no palpable lymphadenopathy in the cervical, axillary or supraclavicular LUNGS:  clear to auscultation and percussion with normal breathing effort HEART: regular rate & rhythm and no murmurs and no lower extremity edema ABDOMEN:abdomen soft,  non-tender and normal bowel sounds Musculoskeletal:no cyanosis of digits and no clubbing  NEURO: alert & oriented x 3 with fluent speech, no focal motor/sensory deficits  LABORATORY DATA: Results for orders placed in visit on 04/29/13 (from the past 48 hour(s))  CBC WITH DIFFERENTIAL     Status: Abnormal   Collection Time    04/29/13  9:40 AM      Result Value Range   WBC 8.6  3.9 - 10.3 10e3/uL   NEUT# 6.5  1.5 - 6.5 10e3/uL   HGB 13.7  11.6 - 15.9 g/dL   HCT 16.1  09.6 - 04.5 %   Platelets 305  145 - 400 10e3/uL   MCV 89.0  79.5 - 101.0 fL   MCH 29.7  25.1 - 34.0 pg   MCHC 33.4  31.5 - 36.0 g/dL   RBC 4.09  8.11 - 9.14 10e6/uL   RDW 17.9 (*) 11.2 - 14.5 %   lymph# 1.5  0.9 - 3.3 10e3/uL   MONO# 0.5  0.1 - 0.9 10e3/uL   Eosinophils Absolute 0.1  0.0 - 0.5 10e3/uL   Basophils Absolute 0.1  0.0 - 0.1 10e3/uL   NEUT% 75.7  38.4 - 76.8 %   LYMPH% 17.0  14.0 - 49.7 %   MONO% 5.4  0.0 - 14.0 %   EOS% 1.2  0.0 - 7.0 %   BASO% 0.7  0.0 - 2.0 %  FERRITIN [facility]     Status: None   Collection Time    04/29/13  9:40 AM      Result Value Range   Ferritin 46  9 - 269 ng/ml  IRON AND TIBC [facility]     Status: None   Collection Time    04/29/13  9:40 AM      Result Value Range   Iron 92  41 - 142 ug/dL   TIBC 782  956 - 213 ug/dL   UIBC 086  578 - 469 ug/dL   %SAT 37  21 - 57 %    Labs:  Lab Results  Component Value Date   WBC 8.6 04/29/2013   HGB 13.7 04/29/2013   HCT 40.9 04/29/2013   MCV 89.0 04/29/2013   PLT 305 04/29/2013   NEUTROABS 6.5 04/29/2013      Chemistry      Component Value Date/Time   NA 136 05/09/2012 1516   NA 138 04/05/2012 2329   K 3.7 05/09/2012 1516   K 3.5 04/05/2012 2329   CL 107 05/09/2012 1516   CL 102 04/05/2012 2329   CO2 24 05/09/2012 1516   CO2 23 01/31/2009 1329   BUN 12.0 05/09/2012 1516   BUN 12 04/05/2012 2329   CREATININE 0.7 05/09/2012 1516   CREATININE 0.80 04/05/2012 2329      Component Value Date/Time   CALCIUM 9.2 05/09/2012  1516   CALCIUM 9.4 01/31/2009 1329   ALKPHOS 57 05/09/2012 1516   AST 14 05/09/2012 1516   ALT <6 Repeated and Verified 05/09/2012 1516   BILITOT 0.22 05/09/2012 1516      CBC:  Recent Labs Lab 04/29/13 0940  WBC 8.6  NEUTROABS 6.5  HGB 13.7  HCT 40.9  MCV 89.0  PLT 305   Anemia work up  Recent Labs  04/29/13 0940  FERRITIN 46  TIBC 250  IRON 92   Studies:  No results found.   RADIOGRAPHIC STUDIES: No results found.  ASSESSMENT: Helen Ford 32 y.o. female with a history of Iron deficiency anemia - Plan: CBC with Differential, CBC with Differential, Comprehensive metabolic panel, Ferritin, Ferritin, Iron and TIBC, Iron and TIBC  PLAN:   1. Iron deficiency anemia secondary #2.  --Her indices appear within normal limits with a normal hemoglobin.  She continues to do well. We will repeat her labs including CBC, CMP, Ferritin and iron studies every 3 months or as needed.  She was provided a handout on the symptoms of anemia.   2. Menorrhagia. --She will follow-up with her gyenecology as needed.   OCPs did not work in the past.   3. Follow-up. --Patient was instructed to follow-up in 3 months for labs and in 6 months for a clinic office visit.   All questions were answered. The patient knows to call the clinic with any problems, questions or concerns. We can certainly see the patient much sooner if necessary.  I spent 10 minutes counseling the patient face to face. The total time spent in the appointment was 15 minutes.  Jelisa Brockway, MD 04/29/2013 11:24 AM

## 2013-04-30 ENCOUNTER — Telehealth: Payer: Self-pay | Admitting: Internal Medicine

## 2013-04-30 NOTE — Telephone Encounter (Signed)
pt returned call and confirmed March and June 2015 appts

## 2013-04-30 NOTE — Telephone Encounter (Signed)
lvm for pt regarding to March and June 2015 appt...mailed pt letter and avs

## 2013-07-24 ENCOUNTER — Ambulatory Visit (HOSPITAL_BASED_OUTPATIENT_CLINIC_OR_DEPARTMENT_OTHER): Payer: BC Managed Care – PPO

## 2013-07-24 ENCOUNTER — Telehealth: Payer: Self-pay | Admitting: *Deleted

## 2013-07-24 DIAGNOSIS — D509 Iron deficiency anemia, unspecified: Secondary | ICD-10-CM

## 2013-07-24 LAB — CBC WITH DIFFERENTIAL/PLATELET
BASO%: 1 % (ref 0.0–2.0)
Basophils Absolute: 0.1 10*3/uL (ref 0.0–0.1)
EOS%: 0.5 % (ref 0.0–7.0)
Eosinophils Absolute: 0.1 10*3/uL (ref 0.0–0.5)
HCT: 40.9 % (ref 34.8–46.6)
HGB: 13.8 g/dL (ref 11.6–15.9)
LYMPH%: 12.7 % — ABNORMAL LOW (ref 14.0–49.7)
MCH: 31.1 pg (ref 25.1–34.0)
MCHC: 33.7 g/dL (ref 31.5–36.0)
MCV: 92.2 fL (ref 79.5–101.0)
MONO#: 0.5 10*3/uL (ref 0.1–0.9)
MONO%: 3.8 % (ref 0.0–14.0)
NEUT#: 10.2 10*3/uL — ABNORMAL HIGH (ref 1.5–6.5)
NEUT%: 82 % — ABNORMAL HIGH (ref 38.4–76.8)
Platelets: 322 10*3/uL (ref 145–400)
RBC: 4.44 10*6/uL (ref 3.70–5.45)
RDW: 13 % (ref 11.2–14.5)
WBC: 12.4 10*3/uL — AB (ref 3.9–10.3)
lymph#: 1.6 10*3/uL (ref 0.9–3.3)

## 2013-07-24 LAB — IRON AND TIBC CHCC
%SAT: 16 % — ABNORMAL LOW (ref 21–57)
IRON: 51 ug/dL (ref 41–142)
TIBC: 309 ug/dL (ref 236–444)
UIBC: 258 ug/dL (ref 120–384)

## 2013-07-24 LAB — FERRITIN CHCC: FERRITIN: 11 ng/mL (ref 9–269)

## 2013-07-24 NOTE — Telephone Encounter (Signed)
Pt came in today thinking shes schedule for labs. She cancel her appt for 07/28/13 @ 3:30pm. gv pt appt for 07/24/13@ 11:45am to hve labs...td

## 2013-07-28 ENCOUNTER — Other Ambulatory Visit: Payer: BC Managed Care – PPO

## 2013-10-28 ENCOUNTER — Telehealth: Payer: Self-pay | Admitting: Internal Medicine

## 2013-10-28 ENCOUNTER — Ambulatory Visit (HOSPITAL_BASED_OUTPATIENT_CLINIC_OR_DEPARTMENT_OTHER): Payer: BC Managed Care – PPO | Admitting: Internal Medicine

## 2013-10-28 ENCOUNTER — Encounter: Payer: Self-pay | Admitting: Internal Medicine

## 2013-10-28 ENCOUNTER — Other Ambulatory Visit (HOSPITAL_BASED_OUTPATIENT_CLINIC_OR_DEPARTMENT_OTHER): Payer: BC Managed Care – PPO

## 2013-10-28 VITALS — BP 106/72 | HR 76 | Temp 98.6°F | Resp 18 | Ht 61.0 in | Wt 114.3 lb

## 2013-10-28 DIAGNOSIS — D509 Iron deficiency anemia, unspecified: Secondary | ICD-10-CM

## 2013-10-28 DIAGNOSIS — D473 Essential (hemorrhagic) thrombocythemia: Secondary | ICD-10-CM

## 2013-10-28 DIAGNOSIS — D75839 Thrombocytosis, unspecified: Secondary | ICD-10-CM

## 2013-10-28 DIAGNOSIS — N946 Dysmenorrhea, unspecified: Secondary | ICD-10-CM

## 2013-10-28 DIAGNOSIS — N920 Excessive and frequent menstruation with regular cycle: Secondary | ICD-10-CM

## 2013-10-28 DIAGNOSIS — D500 Iron deficiency anemia secondary to blood loss (chronic): Secondary | ICD-10-CM

## 2013-10-28 LAB — CBC WITH DIFFERENTIAL/PLATELET
BASO%: 0.4 % (ref 0.0–2.0)
Basophils Absolute: 0 10*3/uL (ref 0.0–0.1)
EOS%: 1.8 % (ref 0.0–7.0)
Eosinophils Absolute: 0.2 10*3/uL (ref 0.0–0.5)
HCT: 36.9 % (ref 34.8–46.6)
HGB: 12.4 g/dL (ref 11.6–15.9)
LYMPH%: 19.5 % (ref 14.0–49.7)
MCH: 30.6 pg (ref 25.1–34.0)
MCHC: 33.7 g/dL (ref 31.5–36.0)
MCV: 90.6 fL (ref 79.5–101.0)
MONO#: 0.5 10*3/uL (ref 0.1–0.9)
MONO%: 5.1 % (ref 0.0–14.0)
NEUT#: 7.4 10*3/uL — ABNORMAL HIGH (ref 1.5–6.5)
NEUT%: 73.2 % (ref 38.4–76.8)
Platelets: 335 10*3/uL (ref 145–400)
RBC: 4.07 10*6/uL (ref 3.70–5.45)
RDW: 13.9 % (ref 11.2–14.5)
WBC: 10.2 10*3/uL (ref 3.9–10.3)
lymph#: 2 10*3/uL (ref 0.9–3.3)

## 2013-10-28 LAB — COMPREHENSIVE METABOLIC PANEL (CC13)
ALK PHOS: 58 U/L (ref 40–150)
ALT: 6 U/L (ref 0–55)
AST: 18 U/L (ref 5–34)
Albumin: 3.6 g/dL (ref 3.5–5.0)
Anion Gap: 6 mEq/L (ref 3–11)
BUN: 8.5 mg/dL (ref 7.0–26.0)
CO2: 24 mEq/L (ref 22–29)
Calcium: 8.7 mg/dL (ref 8.4–10.4)
Chloride: 108 mEq/L (ref 98–109)
Creatinine: 0.8 mg/dL (ref 0.6–1.1)
Glucose: 110 mg/dl (ref 70–140)
POTASSIUM: 3.7 meq/L (ref 3.5–5.1)
Sodium: 139 mEq/L (ref 136–145)
Total Bilirubin: 0.3 mg/dL (ref 0.20–1.20)
Total Protein: 7.1 g/dL (ref 6.4–8.3)

## 2013-10-28 NOTE — Telephone Encounter (Signed)
gv and printed appt sched and avs for pt fro June/Sept and Dec....sed added tx.

## 2013-10-28 NOTE — Progress Notes (Signed)
[facility] OFFICE PROGRESS NOTE  Helen Phenix, MD [address], [place] [address]  DIAGNOSIS: Iron deficiency anemia  Dysmenorrhea  Thrombocytosis  Chief Complaint  Patient presents with  . Follow-up    CURRENT THERAPY: Feraheme 1,020 mg.  She received a dose on 05/20/2012 and on 02/20/2013.   INTERVAL HISTORY: Helen Ford 33 y.o. female with a history of iron-deficiency anemia secondary to dysmenorrhea is here for follow-up.  She was last seen by me on 04/29/2013. Helen Ford presented to the clinic by herself today.  She received feraheme 1,020 mg on 02/20/2013 with improvement in her symptoms, mainly fatigue.  She denies chest pain, shortness of breath, dyspnea on exertion.  She denies abdominal pain, nausea or vomiting.   She denies emergency room visits or hospitalizations.  She denies picca.   Of note, on her initial consultation with Dr. Lamonte Ford on 05/09/2012 she reported a history of menometrorrhagia. Her menstrual cycle lasts for about 7 days every 21 days. The first 4 days were heavy when she needed to change pads every 2 hours with lots of clot. She had history of iron deficiency in the past. She could not tolerate oral contraceptives and oral iron due to nausea/vomiting, abdominal cramp. She has not been on oral iron for many years. For the past few weeks, she has been feeling week. 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 She has been having low appetite from what she claimed stress from work. She has about 10-lb nonintentional weight loss. She presented to PCP with anemia. She received iron on 05/20/2012.   MEDICAL HISTORY: Past Medical History  Diagnosis Date  . Dysmenorrhea   . 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 Iron deficiency anemia   . Thrombocytosis    INTERIM HISTORY: has Iron deficiency anemia; Thrombocytosis; and Dysmenorrhea on her problem list.    ALLERGIES:  has No Known  Allergies.  MEDICATIONS: has a current medication list which includes the following prescription(s): multivitamin.  SURGICAL HISTORY: No past surgical history on file.  REVIEW OF SYSTEMS:   Constitutional: Denies fevers, chills or abnormal weight loss Eyes: Denies blurriness of vision Ears, nose, mouth, throat, and face: Denies mucositis or sore throat Respiratory: Denies cough, dyspnea or wheezes Cardiovascular: Denies palpitation, chest discomfort or lower extremity swelling Gastrointestinal:  Denies nausea, heartburn or change in bowel habits Skin: Denies abnormal skin rashes Lymphatics: Denies new lymphadenopathy or easy bruising Neurological:Denies numbness, tingling or new weaknesses Behavioral/Psych: Mood is stable, no new changes  All other systems were reviewed with the patient and are negative.  PHYSICAL EXAMINATION: ECOG PERFORMANCE STATUS: 0 - Asymptomatic  Blood pressure 106/72, pulse 76, temperature 98.6 F (37 C), resp. rate 18, height 5\' 1"  (1.549 m), weight 114 lb 4.8 oz (51.846 kg).  GENERAL:alert, no distress and comfortable; well developed, well noursished.  SKIN: skin color, texture, turgor are normal, no rashes or significant lesions EYES: normal, Conjunctiva are pink and non-injected, sclera clear OROPHARYNX:no exudate, no erythema and lips, buccal mucosa, and tongue normal  NECK: supple, thyroid normal size, non-tender, without nodularity LYMPH:  no palpable lymphadenopathy in the cervical, axillary or supraclavicular LUNGS: clear to auscultation and percussion with normal breathing effort HEART: regular rate & rhythm and no murmurs and no lower extremity edema ABDOMEN:abdomen soft, non-tender and normal bowel sounds Musculoskeletal:no cyanosis of digits and no clubbing  NEURO: alert & oriented x 3 with fluent speech,  no focal motor/sensory deficits  LABORATORY DATA: Results for orders placed in visit on 10/28/13 (from the past 48 hour(s))  CBC WITH  DIFFERENTIAL     Status: Abnormal   Collection Time    10/28/13  3:02 PM      Result Value Ref Range   WBC 10.2  3.9 - 10.3 10e3/uL   NEUT# 7.4 (*) 1.5 - 6.5 10e3/uL   HGB 12.4  11.6 - 15.9 g/dL   HCT 36.9  34.8 - 46.6 %   Platelets 335  145 - 400 10e3/uL   MCV 90.6  79.5 - 101.0 fL   MCH 30.6  25.1 - 34.0 pg   MCHC 33.7  31.5 - 36.0 g/dL   RBC 4.07  3.70 - 5.45 10e6/uL   RDW 13.9  11.2 - 14.5 %   lymph# 2.0  0.9 - 3.3 10e3/uL   MONO# 0.5  0.1 - 0.9 10e3/uL   Eosinophils Absolute 0.2  0.0 - 0.5 10e3/uL   Basophils Absolute 0.0  0.0 - 0.1 10e3/uL   NEUT% 73.2  38.4 - 76.8 %   LYMPH% 19.5  14.0 - 49.7 %   MONO% 5.1  0.0 - 14.0 %   EOS% 1.8  0.0 - 7.0 %   BASO% 0.4  0.0 - 2.0 %    Labs:  Lab Results  Component Value Date   WBC 10.2 10/28/2013   HGB 12.4 10/28/2013   HCT 36.9 10/28/2013   MCV 90.6 10/28/2013   PLT 335 10/28/2013   NEUTROABS 7.4* 10/28/2013      Chemistry      Component Value Date/Time   NA 136 05/09/2012 1516   NA 138 04/05/2012 2329   K 3.7 05/09/2012 1516   K 3.5 04/05/2012 2329   CL 107 05/09/2012 1516   CL 102 04/05/2012 2329   CO2 24 05/09/2012 1516   CO2 23 01/31/2009 1329   BUN 12.0 05/09/2012 1516   BUN 12 04/05/2012 2329   CREATININE 0.7 05/09/2012 1516   CREATININE 0.80 04/05/2012 2329      Component Value Date/Time   CALCIUM 9.2 05/09/2012 1516   CALCIUM 9.4 01/31/2009 1329   ALKPHOS 57 05/09/2012 1516   AST 14 05/09/2012 1516   ALT <6 Repeated and Verified 05/09/2012 1516   BILITOT 0.22 05/09/2012 1516      CBC:  Recent Labs Lab 10/28/13 1502  WBC 10.2  NEUTROABS 7.4*  HGB 12.4  HCT 36.9  MCV 90.6  PLT 335   Anemia work up No results found for this basename: VITAMINB12, FOLATE, FERRITIN, TIBC, IRON, RETICCTPCT,  in the last 72 hours Studies:  No results found.  Results for Helen Ford (MRN [id number]) as of 10/28/2013 16:33  Ref. Range 07/24/2013 11:50  Iron Latest Range: 42-145 ug/dL 51  UIBC Latest Range: 125-400  ug/dL 258  TIBC Latest Range: 250-470 ug/dL 309  %SAT Latest Range: 20-55 % 16 (L)  Ferritin Latest Range: 10-291 ng/mL 11   RADIOGRAPHIC STUDIES: No results found.  ASSESSMENT: Helen Ford 33 y.o. female with a history of Iron deficiency anemia  Dysmenorrhea  Thrombocytosis  PLAN:   1. Iron deficiency anemia secondary #2.  --Her ferritin appeared low with a normal hemoglobin on 07/24/2013.  She continues to do well. We will repeat her labs including CBC, CMP, Ferritin and iron studies every 3 months or as needed.  She was provided a handout on the symptoms of anemia.   --We will give her feraheme 1,020  mg on 10/30/2013 for her drop in hemoglobin and decreasing ferritin.    2. Menorrhagia. --She will follow-up with her gyenecology as needed.   OCPs did not work in the past.   3. Follow-up. --Patient was instructed to follow-up in 3 months for labs and in 6 months for a clinic office visit.   All questions were answered. The patient knows to call the clinic with any problems, questions or concerns. We can certainly see the patient much sooner if necessary.  I spent 10 minutes counseling the patient face to face. The total time spent in the appointment was 15 minutes.  Kately Graffam, MD 10/28/2013 3:40 PM

## 2013-10-29 LAB — IRON AND TIBC CHCC
%SAT: 11 % — AB (ref 21–57)
IRON: 35 ug/dL — AB (ref 41–142)
TIBC: 323 ug/dL (ref 236–444)
UIBC: 288 ug/dL (ref 120–384)

## 2013-10-29 LAB — FERRITIN CHCC: Ferritin: 6 ng/ml — ABNORMAL LOW (ref 9–269)

## 2013-10-30 ENCOUNTER — Ambulatory Visit (HOSPITAL_BASED_OUTPATIENT_CLINIC_OR_DEPARTMENT_OTHER): Payer: BC Managed Care – PPO

## 2013-10-30 VITALS — BP 94/69 | HR 79 | Temp 98.0°F | Resp 16

## 2013-10-30 DIAGNOSIS — D509 Iron deficiency anemia, unspecified: Secondary | ICD-10-CM

## 2013-10-30 DIAGNOSIS — N946 Dysmenorrhea, unspecified: Secondary | ICD-10-CM

## 2013-10-30 MED ORDER — SODIUM CHLORIDE 0.9 % IV SOLN
1020.0000 mg | Freq: Once | INTRAVENOUS | Status: AC
  Administered 2013-10-30: 1020 mg via INTRAVENOUS
  Filled 2013-10-30: qty 34

## 2013-10-30 MED ORDER — SODIUM CHLORIDE 0.9 % IV SOLN
Freq: Once | INTRAVENOUS | Status: AC
  Administered 2013-10-30: 15:00:00 via INTRAVENOUS

## 2013-10-30 NOTE — Progress Notes (Signed)
Patient refused to stay 30 mins post feraheme infusion. Discharged ambulatory, no acute distress. Tolerated feraheme well.

## 2013-10-30 NOTE — Patient Instructions (Signed)
Ferumoxytol injection (Feraheme) What is this medicine? FERUMOXYTOL is an iron complex. Iron is used to make healthy red blood cells, which carry oxygen and nutrients throughout the body. This medicine is used to treat iron deficiency anemia in people with chronic kidney disease. This medicine may be used for other purposes; ask your health care provider or pharmacist if you have questions. COMMON BRAND NAME(S): Feraheme  What should I tell my health care provider before I take this medicine? They need to know if you have any of these conditions: -anemia not caused by low iron levels -high levels of iron in the blood -magnetic resonance imaging (MRI) test scheduled -an unusual or allergic reaction to iron, other medicines, foods, dyes, or preservatives -pregnant or trying to get pregnant -breast-feeding How should I use this medicine? This medicine is for injection into a vein. It is given by a health care professional in a hospital or clinic setting. Talk to your pediatrician regarding the use of this medicine in children. Special care may be needed. Overdosage: If you think you've taken too much of this medicine contact a poison control center or emergency room at once. Overdosage: If you think you have taken too much of this medicine contact a poison control center or emergency room at once. NOTE: This medicine is only for you. Do not share this medicine with others. What if I miss a dose? It is important not to miss your dose. Call your doctor or health care professional if you are unable to keep an appointment. What may interact with this medicine? This medicine may interact with the following medications: -other iron products This list may not describe all possible interactions. Give your health care provider a list of all the medicines, herbs, non-prescription drugs, or dietary supplements you use. Also tell them if you smoke, drink alcohol, or use illegal drugs. Some items may  interact with your medicine. What should I watch for while using this medicine? Visit your doctor or healthcare professional regularly. Tell your doctor or healthcare professional if your symptoms do not start to get better or if they get worse. You may need blood work done while you are taking this medicine. You may need to follow a special diet. Talk to your doctor. Foods that contain iron include: whole grains/cereals, dried fruits, beans, or peas, leafy green vegetables, and organ meats (liver, kidney). What side effects may I notice from receiving this medicine? Side effects that you should report to your doctor or health care professional as soon as possible: -allergic reactions like skin rash, itching or hives, swelling of the face, lips, or tongue -breathing problems -changes in blood pressure -feeling faint or lightheaded, falls -fever or chills -flushing, sweating, or hot feelings -swelling of the ankles or feet Side effects that usually do not require medical attention (Report these to your doctor or health care professional if they continue or are bothersome.): -diarrhea -headache -nausea, vomiting -stomach pain This list may not describe all possible side effects. Call your doctor for medical advice about side effects. You may report side effects to FDA at 1-800-FDA-1088. Where should I keep my medicine? This drug is given in a hospital or clinic and will not be stored at home. NOTE: This sheet is a summary. It may not cover all possible information. If you have questions about this medicine, talk to your doctor, pharmacist, or health care provider.  2014, Elsevier/Gold Standard. (2011-12-21 15:23:36)

## 2014-01-20 ENCOUNTER — Other Ambulatory Visit: Payer: BC Managed Care – PPO

## 2014-03-26 ENCOUNTER — Telehealth: Payer: Self-pay | Admitting: Hematology

## 2014-03-26 NOTE — Telephone Encounter (Signed)
MOVED 12/2 LB/FU TO 10:30AM DUE TO CALL DAY - S/W PT SHE IS AWARE.

## 2014-04-14 ENCOUNTER — Telehealth: Payer: Self-pay | Admitting: Hematology

## 2014-04-14 NOTE — Telephone Encounter (Signed)
LM to confirm appt for 05/07/14. Mailed cal.

## 2014-04-21 ENCOUNTER — Other Ambulatory Visit: Payer: BC Managed Care – PPO

## 2014-04-21 ENCOUNTER — Ambulatory Visit: Payer: BC Managed Care – PPO

## 2014-05-06 ENCOUNTER — Other Ambulatory Visit: Payer: Self-pay | Admitting: *Deleted

## 2014-05-06 DIAGNOSIS — D509 Iron deficiency anemia, unspecified: Secondary | ICD-10-CM

## 2014-05-07 ENCOUNTER — Telehealth: Payer: Self-pay | Admitting: Hematology

## 2014-05-07 ENCOUNTER — Other Ambulatory Visit (HOSPITAL_BASED_OUTPATIENT_CLINIC_OR_DEPARTMENT_OTHER): Payer: BC Managed Care – PPO

## 2014-05-07 ENCOUNTER — Ambulatory Visit (HOSPITAL_BASED_OUTPATIENT_CLINIC_OR_DEPARTMENT_OTHER): Payer: BC Managed Care – PPO | Admitting: Hematology

## 2014-05-07 VITALS — BP 110/60 | HR 81 | Temp 98.2°F | Resp 18 | Ht 61.0 in | Wt 117.3 lb

## 2014-05-07 DIAGNOSIS — D509 Iron deficiency anemia, unspecified: Secondary | ICD-10-CM

## 2014-05-07 DIAGNOSIS — D500 Iron deficiency anemia secondary to blood loss (chronic): Secondary | ICD-10-CM

## 2014-05-07 DIAGNOSIS — N946 Dysmenorrhea, unspecified: Secondary | ICD-10-CM

## 2014-05-07 LAB — COMPREHENSIVE METABOLIC PANEL (CC13)
ALBUMIN: 3.7 g/dL (ref 3.5–5.0)
ALT: 6 U/L (ref 0–55)
AST: 20 U/L (ref 5–34)
Alkaline Phosphatase: 54 U/L (ref 40–150)
Anion Gap: 8 mEq/L (ref 3–11)
BUN: 9.4 mg/dL (ref 7.0–26.0)
CO2: 22 meq/L (ref 22–29)
Calcium: 9.3 mg/dL (ref 8.4–10.4)
Chloride: 107 mEq/L (ref 98–109)
Creatinine: 0.8 mg/dL (ref 0.6–1.1)
GLUCOSE: 92 mg/dL (ref 70–140)
POTASSIUM: 4.3 meq/L (ref 3.5–5.1)
Sodium: 137 mEq/L (ref 136–145)
TOTAL PROTEIN: 7.1 g/dL (ref 6.4–8.3)
Total Bilirubin: 0.74 mg/dL (ref 0.20–1.20)

## 2014-05-07 LAB — CBC WITH DIFFERENTIAL/PLATELET
BASO%: 0.2 % (ref 0.0–2.0)
BASOS ABS: 0 10*3/uL (ref 0.0–0.1)
EOS ABS: 0.1 10*3/uL (ref 0.0–0.5)
EOS%: 0.9 % (ref 0.0–7.0)
HCT: 38 % (ref 34.8–46.6)
HGB: 13.3 g/dL (ref 11.6–15.9)
LYMPH#: 1.6 10*3/uL (ref 0.9–3.3)
LYMPH%: 17.6 % (ref 14.0–49.7)
MCH: 30.7 pg (ref 25.1–34.0)
MCHC: 35 g/dL (ref 31.5–36.0)
MCV: 87.8 fL (ref 79.5–101.0)
MONO#: 0.7 10*3/uL (ref 0.1–0.9)
MONO%: 8.2 % (ref 0.0–14.0)
NEUT#: 6.4 10*3/uL (ref 1.5–6.5)
NEUT%: 73.1 % (ref 38.4–76.8)
Platelets: 284 10*3/uL (ref 145–400)
RBC: 4.33 10*6/uL (ref 3.70–5.45)
RDW: 12.9 % (ref 11.2–14.5)
WBC: 8.8 10*3/uL (ref 3.9–10.3)

## 2014-05-07 LAB — IRON AND TIBC CHCC
%SAT: 68 % — ABNORMAL HIGH (ref 21–57)
IRON: 181 ug/dL — AB (ref 41–142)
TIBC: 268 ug/dL (ref 236–444)
UIBC: 86 ug/dL — AB (ref 120–384)

## 2014-05-07 LAB — FERRITIN CHCC: Ferritin: 26 ng/ml (ref 9–269)

## 2014-05-07 NOTE — Progress Notes (Signed)
[facility] OFFICE PROGRESS NOTE  Helen Phenix, MD [address], [place] [address]  DIAGNOSIS: Iron deficiency anemia - Plan: CBC & Diff and Retic, Comprehensive metabolic panel (Cmet) - [facility], Ferritin  Chief Complaint  Patient presents with  . Follow-up    CURRENT THERAPY: Feraheme 1,020 mg.  She received a dose on 10/30/2013.   INTERVAL HISTORY: Helen Ford 33 y.o. female with a history of iron-deficiency anemia secondary to dysmenorrhea is here for follow-up.  She was last seen by Dr. Juliann Mule who has left the practice. She is doing well overall. She has mild fatigue, no dyspnea, chest pain or other complains. She still has heavy menstrual period. No other new event.    MEDICAL HISTORY: Past Medical History  Diagnosis Date  . Dysmenorrhea   . 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 Iron deficiency anemia   . Thrombocytosis    INTERIM HISTORY: has Iron deficiency anemia; Thrombocytosis; and Dysmenorrhea on her problem list.    ALLERGIES:  has No Known Allergies.  MEDICATIONS: has a current medication list which includes the following prescription(s): multivitamin.  SURGICAL HISTORY: History reviewed. No pertinent past surgical history.  REVIEW OF SYSTEMS:   Constitutional: Denies fevers, chills or abnormal weight loss Eyes: Denies blurriness of vision Ears, nose, mouth, throat, and face: Denies mucositis or sore throat Respiratory: Denies cough, dyspnea or wheezes Cardiovascular: Denies palpitation, chest discomfort or lower extremity swelling Gastrointestinal:  Denies nausea, heartburn or change in bowel habits Skin: Denies abnormal skin rashes Lymphatics: Denies new lymphadenopathy or easy bruising Neurological:Denies numbness, tingling or new weaknesses Behavioral/Psych: Mood is stable, no new changes  All other systems were reviewed with the patient and are negative.  PHYSICAL EXAMINATION: ECOG PERFORMANCE STATUS: 0 -  Asymptomatic  Blood pressure 110/60, pulse 81, temperature 98.2 F (36.8 C), temperature source Oral, resp. rate 18, height 5' 1"  (1.549 m), weight 117 lb 4.8 oz (53.207 kg), SpO2 100 %.  GENERAL:alert, no distress and comfortable; well developed, well noursished.  SKIN: skin color, texture, turgor are normal, no rashes or significant lesions EYES: normal, Conjunctiva are pink and non-injected, sclera clear OROPHARYNX:no exudate, no erythema and lips, buccal mucosa, and tongue normal  NECK: supple, thyroid normal size, non-tender, without nodularity LYMPH:  no palpable lymphadenopathy in the cervical, axillary or supraclavicular LUNGS: clear to auscultation and percussion with normal breathing effort HEART: regular rate & rhythm and no murmurs and no lower extremity edema ABDOMEN:abdomen soft, non-tender and normal bowel sounds Musculoskeletal:no cyanosis of digits and no clubbing  NEURO: alert & oriented x 3 with fluent speech, no focal motor/sensory deficits  LABORATORY DATA: Results for orders placed or performed in visit on 05/07/14 (from the past 48 hour(s))  CBC with Differential     Status: None   Collection Time: 05/07/14  9:45 AM  Result Value Ref Range   WBC 8.8 3.9 - 10.3 10e3/uL   NEUT# 6.4 1.5 - 6.5 10e3/uL   HGB 13.3 11.6 - 15.9 g/dL   HCT 38.0 34.8 - 46.6 %   Platelets 284 145 - 400 10e3/uL   MCV 87.8 79.5 - 101.0 fL   MCH 30.7 25.1 - 34.0 pg   MCHC 35.0 31.5 - 36.0 g/dL   RBC 4.33 3.70 - 5.45 10e6/uL   RDW 12.9 11.2 - 14.5 %   lymph# 1.6 0.9 - 3.3 10e3/uL   MONO# 0.7 0.1 - 0.9 10e3/uL   Eosinophils Absolute 0.1 0.0 - 0.5 10e3/uL  Basophils Absolute 0.0 0.0 - 0.1 10e3/uL   NEUT% 73.1 38.4 - 76.8 %   LYMPH% 17.6 14.0 - 49.7 %   MONO% 8.2 0.0 - 14.0 %   EOS% 0.9 0.0 - 7.0 %   BASO% 0.2 0.0 - 2.0 %  Ferritin     Status: None   Collection Time: 05/07/14  9:45 AM  Result Value Ref Range   Ferritin 26 9 - 269 ng/ml  Iron and TIBC [facility]     Status: Abnormal    Collection Time: 05/07/14  9:45 AM  Result Value Ref Range   Iron 181 (H) 41 - 142 ug/dL   TIBC 268 236 - 444 ug/dL   UIBC 86 (L) 120 - 384 ug/dL   %SAT 68 (H) 21 - 57 %  Comprehensive metabolic panel (Cmet) - [facility]     Status: None   Collection Time: 05/07/14  9:46 AM  Result Value Ref Range   Sodium 137 136 - 145 mEq/L   Potassium 4.3 3.5 - 5.1 mEq/L   Chloride 107 98 - 109 mEq/L   CO2 22 22 - 29 mEq/L   Glucose 92 70 - 140 mg/dl   BUN 9.4 7.0 - 26.0 mg/dL   Creatinine 0.8 0.6 - 1.1 mg/dL   Total Bilirubin 0.74 0.20 - 1.20 mg/dL   Alkaline Phosphatase 54 40 - 150 U/L   AST 20 5 - 34 U/L   ALT 6 0 - 55 U/L   Total Protein 7.1 6.4 - 8.3 g/dL   Albumin 3.7 3.5 - 5.0 g/dL   Calcium 9.3 8.4 - 10.4 mg/dL   Anion Gap 8 3 - 11 mEq/L   EGFR >90 >90 ml/min/1.73 m2    Comment: eGFR is calculated using the CKD-EPI Creatinine Equation (2009)    Labs:  Lab Results  Component Value Date   WBC 8.8 05/07/2014   HGB 13.3 05/07/2014   HCT 38.0 05/07/2014   MCV 87.8 05/07/2014   PLT 284 05/07/2014   NEUTROABS 6.4 05/07/2014      Chemistry      Component Value Date/Time   NA 137 05/07/2014 0946   NA 138 04/05/2012 2329   K 4.3 05/07/2014 0946   K 3.5 04/05/2012 2329   CL 107 05/09/2012 1516   CL 102 04/05/2012 2329   CO2 22 05/07/2014 0946   CO2 23 01/31/2009 1329   BUN 9.4 05/07/2014 0946   BUN 12 04/05/2012 2329   CREATININE 0.8 05/07/2014 0946   CREATININE 0.80 04/05/2012 2329      Component Value Date/Time   CALCIUM 9.3 05/07/2014 0946   CALCIUM 9.4 01/31/2009 1329   ALKPHOS 54 05/07/2014 0946   AST 20 05/07/2014 0946   ALT 6 05/07/2014 0946   BILITOT 0.74 05/07/2014 0946      CBC:  Recent Labs Lab 05/07/14 0945  WBC 8.8  NEUTROABS 6.4  HGB 13.3  HCT 38.0  MCV 87.8  PLT 284   Anemia work up  Recent Labs  05/07/14 0945  FERRITIN 26  TIBC 268  IRON 181*     RADIOGRAPHIC STUDIES: No results found.  ASSESSMENT: Helen Ford 33 y.o. female with a  history of Iron deficiency anemia - Plan: CBC & Diff and Retic, Comprehensive metabolic panel (Cmet) - [facility], Ferritin  PLAN:   1. Iron deficiency anemia secondary #2.  -Her CBC is normal today, but ferritin level is low again, she has mild fatigue. Will arrange feraheme next week. She refuses take  oral iron.  -follow up CBC and ferritin every 3 month   2. Menorrhagia. --She will follow-up with her gyenecology as needed.   OCPs did not work in the past.   Plan 1. Your ferritin level is low again today, will schedule your iv iron infusion next week 2. RTC in 3 month with infusion apointment, lab a few days before the appointment    All questions were answered. The patient knows to call the clinic with any problems, questions or concerns. We can certainly see the patient much sooner if necessary.  I spent 10 minutes counseling the patient face to face. The total time spent in the appointment was 15 minutes.  Truitt Merle, MD 05/08/2014 10:52 AM

## 2014-05-07 NOTE — Telephone Encounter (Signed)
[facility] APPT SCHEDULE FOR MARCH/APRIL 2016

## 2014-05-08 ENCOUNTER — Encounter: Payer: Self-pay | Admitting: Hematology

## 2014-05-10 ENCOUNTER — Telehealth: Payer: Self-pay | Admitting: *Deleted

## 2014-05-10 ENCOUNTER — Telehealth: Payer: Self-pay | Admitting: Hematology

## 2014-05-10 NOTE — Telephone Encounter (Signed)
Per staff message and POF I have scheduled appts. Advised scheduler of appts. JMW  

## 2014-05-10 NOTE — Telephone Encounter (Signed)
LM to confirm appt for Dec.

## 2014-05-10 NOTE — Telephone Encounter (Signed)
Spoke with pt at work today.  Informed pt that Ferritin level done 05/07/14 was low as per Dr. Burr Medico.  Informed pt that a scheduler will contact pt with date and time for Feraheme infusion as per md's instructions.  Pt voiced understanding and requested appt message to be left on cell phone voice mail.  Message relayed to [facility], infusion scheduler.

## 2014-05-18 ENCOUNTER — Ambulatory Visit (HOSPITAL_BASED_OUTPATIENT_CLINIC_OR_DEPARTMENT_OTHER): Payer: BC Managed Care – PPO

## 2014-05-18 ENCOUNTER — Other Ambulatory Visit: Payer: Self-pay | Admitting: Hematology

## 2014-05-18 DIAGNOSIS — N946 Dysmenorrhea, unspecified: Secondary | ICD-10-CM

## 2014-05-18 DIAGNOSIS — D509 Iron deficiency anemia, unspecified: Secondary | ICD-10-CM

## 2014-05-18 MED ORDER — SODIUM CHLORIDE 0.9 % IV SOLN
510.0000 mg | Freq: Once | INTRAVENOUS | Status: AC
  Administered 2014-05-18: 510 mg via INTRAVENOUS
  Filled 2014-05-18: qty 17

## 2014-05-18 MED ORDER — SODIUM CHLORIDE 0.9 % IJ SOLN
10.0000 mL | INTRAMUSCULAR | Status: DC | PRN
  Filled 2014-05-18: qty 10

## 2014-05-18 MED ORDER — HEPARIN SOD (PORK) LOCK FLUSH 100 UNIT/ML IV SOLN
500.0000 [IU] | Freq: Once | INTRAVENOUS | Status: DC | PRN
  Filled 2014-05-18: qty 5

## 2014-05-18 MED ORDER — SODIUM CHLORIDE 0.9 % IV SOLN
Freq: Once | INTRAVENOUS | Status: AC
  Administered 2014-05-18: 16:00:00 via INTRAVENOUS

## 2014-05-18 MED ORDER — ALTEPLASE 2 MG IJ SOLR
2.0000 mg | Freq: Once | INTRAMUSCULAR | Status: DC | PRN
Start: 2014-05-18 — End: 2014-05-18
  Filled 2014-05-18: qty 2

## 2014-05-18 MED ORDER — SODIUM CHLORIDE 0.9 % IJ SOLN
3.0000 mL | Freq: Once | INTRAMUSCULAR | Status: DC | PRN
  Filled 2014-05-18: qty 10

## 2014-05-18 MED ORDER — HEPARIN SOD (PORK) LOCK FLUSH 100 UNIT/ML IV SOLN
250.0000 [IU] | Freq: Once | INTRAVENOUS | Status: DC | PRN
  Filled 2014-05-18: qty 5

## 2014-05-18 NOTE — Patient Instructions (Signed)
Iron Deficiency Anemia Anemia is a condition in which there are less red blood cells or hemoglobin in the blood than normal. Hemoglobin is the part of red blood cells that carries oxygen. Iron deficiency anemia is anemia caused by too little iron. It is the most common type of anemia. It may leave you tired and short of breath. CAUSES   Lack of iron in the diet.  Poor absorption of iron, as seen with intestinal disorders.  Intestinal bleeding.  Heavy periods. SIGNS AND SYMPTOMS  Mild anemia may not be noticeable. Symptoms may include:  Fatigue.  Headache.  Pale skin.  Weakness.  Tiredness.  Shortness of breath.  Dizziness.  Cold hands and feet.  Fast or irregular heartbeat. DIAGNOSIS  Diagnosis requires a thorough evaluation and physical exam by your health care provider. Blood tests are generally used to confirm iron deficiency anemia. Additional tests may be done to find the underlying cause of your anemia. These may include:  Testing for blood in the stool (fecal occult blood test).  A procedure to see inside the colon and rectum (colonoscopy).  A procedure to see inside the esophagus and stomach (endoscopy). TREATMENT  Iron deficiency anemia is treated by correcting the cause of the deficiency. Treatment may involve:  Adding iron-rich foods to your diet.  Taking iron supplements. Pregnant or breastfeeding women need to take extra iron because their normal diet usually does not provide the required amount.  Taking vitamins. Vitamin C improves the absorption of iron. Your health care provider may recommend that you take your iron tablets with a glass of orange juice or vitamin C supplement.  Medicines to make heavy menstrual flow lighter.  Surgery. HOME CARE INSTRUCTIONS   Take iron as directed by your health care provider.  If you cannot tolerate taking iron supplements by mouth, talk to your health care provider about taking them through a vein  (intravenously) or an injection into a muscle.  For the best iron absorption, iron supplements should be taken on an empty stomach. If you cannot tolerate them on an empty stomach, you may need to take them with food.  Do not drink milk or take antacids at the same time as your iron supplements. Milk and antacids may interfere with the absorption of iron.  Iron supplements can cause constipation. Make sure to include fiber in your diet to prevent constipation. A stool softener may also be recommended.  Take vitamins as directed by your health care provider.  Eat a diet rich in iron. Foods high in iron include liver, lean beef, whole-grain bread, eggs, dried fruit, and dark green leafy vegetables. SEEK IMMEDIATE MEDICAL CARE IF:   You faint. If this happens, do not drive. Call your local emergency services (911 in [place] if no other help is available.  You have chest pain.  You feel nauseous or vomit.  You have severe or increased shortness of breath with activity.  You feel weak.  You have a rapid heartbeat.  You have unexplained sweating.  You become light-headed when getting up from a chair or bed. MAKE SURE YOU:   Understand these instructions.  Will watch your condition.  Will get help right away if you are not doing well or get worse. Document Released: 05/04/2000 Document Revised: 05/12/2013 Document Reviewed: 01/12/2013 ExitCare Patient Information 2015 [employer]. This information is not intended to replace advice given to you by your health care provider. Make sure you discuss any questions you have with your health care provider.       Ferumoxytol injection What is this medicine? FERUMOXYTOL is an iron complex. Iron is used to make healthy red blood cells, which carry oxygen and nutrients throughout the body. This medicine is used to treat iron deficiency anemia in people with chronic kidney disease. This medicine may be used for other purposes; ask your health  care provider or pharmacist if you have questions. COMMON BRAND NAME(S): Feraheme What should I tell my health care provider before I take this medicine? They need to know if you have any of these conditions: -anemia not caused by low iron levels -high levels of iron in the blood -magnetic resonance imaging (MRI) test scheduled -an unusual or allergic reaction to iron, other medicines, foods, dyes, or preservatives -pregnant or trying to get pregnant -breast-feeding How should I use this medicine? This medicine is for injection into a vein. It is given by a health care professional in a hospital or clinic setting. Talk to your pediatrician regarding the use of this medicine in children. Special care may be needed. Overdosage: If you think you've taken too much of this medicine contact a poison control center or emergency room at once. Overdosage: If you think you have taken too much of this medicine contact a poison control center or emergency room at once. NOTE: This medicine is only for you. Do not share this medicine with others. What if I miss a dose? It is important not to miss your dose. Call your doctor or health care professional if you are unable to keep an appointment. What may interact with this medicine? This medicine may interact with the following medications: -other iron products This list may not describe all possible interactions. Give your health care provider a list of all the medicines, herbs, non-prescription drugs, or dietary supplements you use. Also tell them if you smoke, drink alcohol, or use illegal drugs. Some items may interact with your medicine. What should I watch for while using this medicine? Visit your doctor or healthcare professional regularly. Tell your doctor or healthcare professional if your symptoms do not start to get better or if they get worse. You may need blood work done while you are taking this medicine. You may need to follow a special diet.  Talk to your doctor. Foods that contain iron include: whole grains/cereals, dried fruits, beans, or peas, leafy green vegetables, and organ meats (liver, kidney). What side effects may I notice from receiving this medicine? Side effects that you should report to your doctor or health care professional as soon as possible: -allergic reactions like skin rash, itching or hives, swelling of the face, lips, or tongue -breathing problems -changes in blood pressure -feeling faint or lightheaded, falls -fever or chills -flushing, sweating, or hot feelings -swelling of the ankles or feet Side effects that usually do not require medical attention (Report these to your doctor or health care professional if they continue or are bothersome.): -diarrhea -headache -nausea, vomiting -stomach pain This list may not describe all possible side effects. Call your doctor for medical advice about side effects. You may report side effects to FDA at 1-800-FDA-1088. Where should I keep my medicine? This drug is given in a hospital or clinic and will not be stored at home. NOTE: This sheet is a summary. It may not cover all possible information. If you have questions about this medicine, talk to your doctor, pharmacist, or health care provider.  2015, Elsevier/Gold Standard. (2011-12-21 15:23:36)   

## 2014-05-25 ENCOUNTER — Ambulatory Visit (HOSPITAL_BASED_OUTPATIENT_CLINIC_OR_DEPARTMENT_OTHER): Payer: BLUE CROSS/BLUE SHIELD

## 2014-05-25 DIAGNOSIS — D509 Iron deficiency anemia, unspecified: Secondary | ICD-10-CM

## 2014-05-25 DIAGNOSIS — N946 Dysmenorrhea, unspecified: Secondary | ICD-10-CM

## 2014-05-25 MED ORDER — SODIUM CHLORIDE 0.9 % IV SOLN
510.0000 mg | Freq: Once | INTRAVENOUS | Status: AC
  Administered 2014-05-25: 510 mg via INTRAVENOUS
  Filled 2014-05-25: qty 17

## 2014-05-25 MED ORDER — SODIUM CHLORIDE 0.9 % IV SOLN
Freq: Once | INTRAVENOUS | Status: AC
  Administered 2014-05-25: 15:00:00 via INTRAVENOUS

## 2014-05-25 NOTE — Patient Instructions (Signed)

## 2014-08-13 ENCOUNTER — Ambulatory Visit (HOSPITAL_BASED_OUTPATIENT_CLINIC_OR_DEPARTMENT_OTHER): Payer: BLUE CROSS/BLUE SHIELD | Admitting: Hematology

## 2014-08-13 ENCOUNTER — Encounter: Payer: Self-pay | Admitting: Hematology

## 2014-08-13 ENCOUNTER — Telehealth: Payer: Self-pay | Admitting: Hematology

## 2014-08-13 ENCOUNTER — Ambulatory Visit (HOSPITAL_BASED_OUTPATIENT_CLINIC_OR_DEPARTMENT_OTHER): Payer: BLUE CROSS/BLUE SHIELD

## 2014-08-13 ENCOUNTER — Other Ambulatory Visit (HOSPITAL_BASED_OUTPATIENT_CLINIC_OR_DEPARTMENT_OTHER): Payer: BLUE CROSS/BLUE SHIELD

## 2014-08-13 VITALS — BP 110/76 | HR 61 | Temp 98.2°F | Resp 18 | Ht 61.0 in | Wt 115.0 lb

## 2014-08-13 DIAGNOSIS — N946 Dysmenorrhea, unspecified: Secondary | ICD-10-CM

## 2014-08-13 DIAGNOSIS — D500 Iron deficiency anemia secondary to blood loss (chronic): Secondary | ICD-10-CM

## 2014-08-13 DIAGNOSIS — D509 Iron deficiency anemia, unspecified: Secondary | ICD-10-CM

## 2014-08-13 LAB — CBC & DIFF AND RETIC
BASO%: 0.4 % (ref 0.0–2.0)
Basophils Absolute: 0 10*3/uL (ref 0.0–0.1)
EOS%: 1.4 % (ref 0.0–7.0)
Eosinophils Absolute: 0.1 10*3/uL (ref 0.0–0.5)
HEMATOCRIT: 42 % (ref 34.8–46.6)
HGB: 14.9 g/dL (ref 11.6–15.9)
Immature Retic Fract: 1.5 % — ABNORMAL LOW (ref 1.60–10.00)
LYMPH%: 20.9 % (ref 14.0–49.7)
MCH: 32 pg (ref 25.1–34.0)
MCHC: 35.5 g/dL (ref 31.5–36.0)
MCV: 90.3 fL (ref 79.5–101.0)
MONO#: 0.6 10*3/uL (ref 0.1–0.9)
MONO%: 7 % (ref 0.0–14.0)
NEUT#: 5.7 10*3/uL (ref 1.5–6.5)
NEUT%: 70.3 % (ref 38.4–76.8)
PLATELETS: 269 10*3/uL (ref 145–400)
RBC: 4.65 10*6/uL (ref 3.70–5.45)
RDW: 13.1 % (ref 11.2–14.5)
RETIC %: 2.4 % — AB (ref 0.70–2.10)
Retic Ct Abs: 111.6 10*3/uL — ABNORMAL HIGH (ref 33.70–90.70)
WBC: 8.1 10*3/uL (ref 3.9–10.3)
lymph#: 1.7 10*3/uL (ref 0.9–3.3)

## 2014-08-13 LAB — COMPREHENSIVE METABOLIC PANEL (CC13)
ALT: 6 U/L (ref 0–55)
AST: 16 U/L (ref 5–34)
Albumin: 3.9 g/dL (ref 3.5–5.0)
Alkaline Phosphatase: 55 U/L (ref 40–150)
Anion Gap: 9 mEq/L (ref 3–11)
BILIRUBIN TOTAL: 0.44 mg/dL (ref 0.20–1.20)
BUN: 10.2 mg/dL (ref 7.0–26.0)
CO2: 22 meq/L (ref 22–29)
Calcium: 9.3 mg/dL (ref 8.4–10.4)
Chloride: 107 mEq/L (ref 98–109)
Creatinine: 0.8 mg/dL (ref 0.6–1.1)
EGFR: >90 mL/min/{1.73_m2} (ref >90)
Glucose: 79 mg/dl (ref 70–140)
Potassium: 4.1 mEq/L (ref 3.5–5.1)
SODIUM: 138 meq/L (ref 136–145)
Total Protein: 7.6 g/dL (ref 6.4–8.3)

## 2014-08-13 LAB — FERRITIN CHCC: Ferritin: 146 ng/ml (ref 9–269)

## 2014-08-13 MED ORDER — SODIUM CHLORIDE 0.9 % IJ SOLN
10.0000 mL | INTRAMUSCULAR | Status: DC | PRN
  Filled 2014-08-13: qty 10

## 2014-08-13 MED ORDER — FERUMOXYTOL INJECTION 510 MG/17 ML
510.0000 mg | Freq: Once | INTRAVENOUS | Status: AC
  Administered 2014-08-13: 510 mg via INTRAVENOUS
  Filled 2014-08-13: qty 17

## 2014-08-13 MED ORDER — SODIUM CHLORIDE 0.9 % IJ SOLN
3.0000 mL | Freq: Once | INTRAMUSCULAR | Status: DC | PRN
  Filled 2014-08-13: qty 10

## 2014-08-13 MED ORDER — SODIUM CHLORIDE 0.9 % IV SOLN: Freq: Once | INTRAVENOUS | Status: DC

## 2014-08-13 MED ORDER — HEPARIN SOD (PORK) LOCK FLUSH 100 UNIT/ML IV SOLN
500.0000 [IU] | Freq: Once | INTRAVENOUS | Status: DC | PRN
  Filled 2014-08-13: qty 5

## 2014-08-13 MED ORDER — ALTEPLASE 2 MG IJ SOLR
2.0000 mg | Freq: Once | INTRAMUSCULAR | Status: DC | PRN
  Filled 2014-08-13: qty 2

## 2014-08-13 MED ORDER — HEPARIN SOD (PORK) LOCK FLUSH 100 UNIT/ML IV SOLN
250.0000 [IU] | Freq: Once | INTRAVENOUS | Status: DC | PRN
  Filled 2014-08-13: qty 5

## 2014-08-13 NOTE — Patient Instructions (Signed)

## 2014-08-13 NOTE — Progress Notes (Signed)
[facility] OFFICE PROGRESS NOTE  Adam Phenix, MD [address], [place] [address]  DIAGNOSIS: Iron deficiency anemia   CURRENT THERAPY: Feraheme 1,020 mg.  She received a dose on 05/25/2013.   INTERVAL HISTORY: Helen Ford 34 y.o. female with a history of iron-deficiency anemia secondary to dysmenorrhea is here for follow-up.  She feels well overall. She had iv feraheme in January and tolerated well, her energy feels slightly better after infusion. She is scheduled for second dose today. No other new complains.    MEDICAL HISTORY: Past Medical History  Diagnosis Date  . Dysmenorrhea   . 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 Iron deficiency anemia   . Thrombocytosis    INTERIM HISTORY: has Iron deficiency anemia; Thrombocytosis; and Dysmenorrhea on her problem list.    ALLERGIES:  has No Known Allergies.  MEDICATIONS: has a current medication list which includes the following prescription(s): multivitamin.  SURGICAL HISTORY: History reviewed. No pertinent past surgical history.  REVIEW OF SYSTEMS:   Constitutional: Denies fevers, chills or abnormal weight loss Eyes: Denies blurriness of vision Ears, nose, mouth, throat, and face: Denies mucositis or sore throat Respiratory: Denies cough, dyspnea or wheezes Cardiovascular: Denies palpitation, chest discomfort or lower extremity swelling Gastrointestinal:  Denies nausea, heartburn or change in bowel habits Skin: Denies abnormal skin rashes Lymphatics: Denies new lymphadenopathy or easy bruising Neurological:Denies numbness, tingling or new weaknesses Behavioral/Psych: Mood is stable, no new changes  All other systems were reviewed with the patient and are negative.  PHYSICAL EXAMINATION: ECOG PERFORMANCE STATUS: 0 - Asymptomatic  Blood pressure 110/76, pulse 61, temperature 98.2 F (36.8 C), temperature source Oral, resp. rate 18, height 5\' 1"  (1.549 m), weight 115 lb (52.164 kg), SpO2  100 %.  GENERAL:alert, no distress and comfortable; well developed, well noursished.  SKIN: skin color, texture, turgor are normal, no rashes or significant lesions EYES: normal, Conjunctiva are pink and non-injected, sclera clear OROPHARYNX:no exudate, no erythema and lips, buccal mucosa, and tongue normal  NECK: supple, thyroid normal size, non-tender, without nodularity LYMPH:  no palpable lymphadenopathy in the cervical, axillary or supraclavicular LUNGS: clear to auscultation and percussion with normal breathing effort HEART: regular rate & rhythm and no murmurs and no lower extremity edema ABDOMEN:abdomen soft, non-tender and normal bowel sounds Musculoskeletal:no cyanosis of digits and no clubbing  NEURO: alert & oriented x 3 with fluent speech, no focal motor/sensory deficits  LABORATORY DATA: CBC Latest Ref Rng 08/13/2014 05/07/2014 10/28/2013  WBC 3.9 - 10.3 10e3/uL 8.1 8.8 10.2  Hemoglobin 11.6 - 15.9 g/dL 14.9 13.3 12.4  Hematocrit 34.8 - 46.6 % 42.0 38.0 36.9  Platelets 145 - 400 10e3/uL 269 284 335   CMP Latest Ref Rng 05/07/2014 10/28/2013 05/09/2012  Glucose 70 - 140 mg/dl 92 110 96  BUN 7.0 - 26.0 mg/dL 9.4 8.5 12.0  Creatinine 0.6 - 1.1 mg/dL 0.8 0.8 0.7  Sodium 136 - 145 mEq/L 137 139 136  Potassium 3.5 - 5.1 mEq/L 4.3 3.7 3.7  Chloride 98 - 107 mEq/L - - 107  CO2 22 - 29 mEq/L 22 24 24   Calcium 8.4 - 10.4 mg/dL 9.3 8.7 9.2  Total Protein 6.4 - 8.3 g/dL 7.1 7.1 7.6  Total Bilirubin 0.20 - 1.20 mg/dL 0.74 0.30 0.22  Alkaline Phos 40 - 150 U/L 54 58 57  AST 5 - 34 U/L 20 18 14   ALT 0 - 55 U/L 6 6 <6 Repeated and Verified  RADIOGRAPHIC STUDIES: No results found.  ASSESSMENT: Helen Ford 35 y.o. female with a history of Iron deficiency anemia  PLAN:   1. Iron deficiency anemia secondary #2.  -Her CBC is normal today -second dose feraheme tdoay She refuses take oral iron.  -follow up CBC and ferritin every 3 month, see me back in 6 month  2.  Menorrhagia. --She will follow-up with her gyenecology as needed.   OCPs did not work in the past.   Plan 1. Second dose Feraheme today 2. Repeat lab in 3 months and see me back in 6 months with lab a few days before the appointment    All questions were answered. The patient knows to call the clinic with any problems, questions or concerns. We can certainly see the patient much sooner if necessary.  I spent 10 minutes counseling the patient face to face. The total time spent in the appointment was 15 minutes.  Truitt Merle, MD 08/13/2014 9:53 AM

## 2014-08-13 NOTE — Telephone Encounter (Signed)
Gave avs & calendar for April/June/September

## 2014-08-20 ENCOUNTER — Ambulatory Visit: Payer: BLUE CROSS/BLUE SHIELD

## 2014-08-20 DIAGNOSIS — N946 Dysmenorrhea, unspecified: Secondary | ICD-10-CM

## 2014-08-20 DIAGNOSIS — D509 Iron deficiency anemia, unspecified: Secondary | ICD-10-CM

## 2014-08-20 MED ORDER — SODIUM CHLORIDE 0.9 % IV SOLN: Freq: Once | INTRAVENOUS | Status: DC

## 2014-08-20 NOTE — Progress Notes (Signed)
Pt in for scheduled appointment.  IV started but then discovered pt did not need Feraheme today.  IV d/c'd and pt discharged home.

## 2014-11-05 ENCOUNTER — Other Ambulatory Visit: Payer: BLUE CROSS/BLUE SHIELD

## 2014-11-12 ENCOUNTER — Other Ambulatory Visit: Payer: BLUE CROSS/BLUE SHIELD

## 2014-11-15 ENCOUNTER — Telehealth: Payer: Self-pay | Admitting: Hematology

## 2014-11-15 ENCOUNTER — Ambulatory Visit (HOSPITAL_BASED_OUTPATIENT_CLINIC_OR_DEPARTMENT_OTHER): Payer: BLUE CROSS/BLUE SHIELD

## 2014-11-15 DIAGNOSIS — D509 Iron deficiency anemia, unspecified: Secondary | ICD-10-CM

## 2014-11-15 LAB — CBC & DIFF AND RETIC
BASO%: 0.3 % (ref 0.0–2.0)
BASOS ABS: 0 10*3/uL (ref 0.0–0.1)
EOS%: 1.1 % (ref 0.0–7.0)
Eosinophils Absolute: 0.1 10*3/uL (ref 0.0–0.5)
HEMATOCRIT: 36 % (ref 34.8–46.6)
HEMOGLOBIN: 12.9 g/dL (ref 11.6–15.9)
Immature Retic Fract: 2.8 % (ref 1.60–10.00)
LYMPH#: 2.1 10*3/uL (ref 0.9–3.3)
LYMPH%: 22.2 % (ref 14.0–49.7)
MCH: 32.1 pg (ref 25.1–34.0)
MCHC: 35.8 g/dL (ref 31.5–36.0)
MCV: 89.6 fL (ref 79.5–101.0)
MONO#: 0.7 10*3/uL (ref 0.1–0.9)
MONO%: 7.4 % (ref 0.0–14.0)
NEUT%: 69 % (ref 38.4–76.8)
NEUTROS ABS: 6.7 10*3/uL — AB (ref 1.5–6.5)
Platelets: 294 10*3/uL (ref 145–400)
RBC: 4.02 10*6/uL (ref 3.70–5.45)
RDW: 12.4 % (ref 11.2–14.5)
Retic %: 1.96 % (ref 0.70–2.10)
Retic Ct Abs: 78.79 10*3/uL (ref 33.70–90.70)
WBC: 9.7 10*3/uL (ref 3.9–10.3)

## 2014-11-15 NOTE — Telephone Encounter (Signed)
lvm pt and advised on June appt....done

## 2014-11-16 LAB — FERRITIN CHCC: FERRITIN: 91 ng/mL (ref 9–269)

## 2014-11-18 ENCOUNTER — Other Ambulatory Visit: Payer: BLUE CROSS/BLUE SHIELD

## 2014-12-13 ENCOUNTER — Telehealth: Payer: Self-pay

## 2014-12-13 ENCOUNTER — Ambulatory Visit (HOSPITAL_BASED_OUTPATIENT_CLINIC_OR_DEPARTMENT_OTHER): Payer: BLUE CROSS/BLUE SHIELD | Admitting: Nurse Practitioner

## 2014-12-13 ENCOUNTER — Encounter: Payer: Self-pay | Admitting: Nurse Practitioner

## 2014-12-13 ENCOUNTER — Other Ambulatory Visit: Payer: Self-pay

## 2014-12-13 ENCOUNTER — Encounter (HOSPITAL_COMMUNITY): Payer: Self-pay

## 2014-12-13 ENCOUNTER — Other Ambulatory Visit (HOSPITAL_COMMUNITY)
Admission: RE | Admit: 2014-12-13 | Discharge: 2014-12-13 | Disposition: A | Discharge status: Home / Self Care | Payer: BLUE CROSS/BLUE SHIELD | Source: Ambulatory Visit | Attending: Hematology | Admitting: Hematology

## 2014-12-13 ENCOUNTER — Ambulatory Visit (HOSPITAL_BASED_OUTPATIENT_CLINIC_OR_DEPARTMENT_OTHER): Payer: BLUE CROSS/BLUE SHIELD

## 2014-12-13 ENCOUNTER — Emergency Department (HOSPITAL_COMMUNITY)
Admission: EM | Admit: 2014-12-13 | Discharge: 2014-12-13 | Disposition: A | Discharge status: Home / Self Care | Payer: BLUE CROSS/BLUE SHIELD | Attending: Physician Assistant | Admitting: Physician Assistant

## 2014-12-13 VITALS — BP 120/78 | HR 74 | Temp 98.8°F | Resp 16 | Wt 114.8 lb

## 2014-12-13 DIAGNOSIS — D509 Iron deficiency anemia, unspecified: Secondary | ICD-10-CM

## 2014-12-13 DIAGNOSIS — Z8742 Personal history of other diseases of the female genital tract: Secondary | ICD-10-CM | POA: Insufficient documentation

## 2014-12-13 DIAGNOSIS — R5383 Other fatigue: Secondary | ICD-10-CM | POA: Insufficient documentation

## 2014-12-13 DIAGNOSIS — Z87891 Personal history of nicotine dependence: Secondary | ICD-10-CM | POA: Insufficient documentation

## 2014-12-13 DIAGNOSIS — R079 Chest pain, unspecified: Secondary | ICD-10-CM | POA: Diagnosis not present

## 2014-12-13 DIAGNOSIS — Z862 Personal history of diseases of the blood and blood-forming organs and certain disorders involving the immune mechanism: Secondary | ICD-10-CM | POA: Insufficient documentation

## 2014-12-13 DIAGNOSIS — R319 Hematuria, unspecified: Secondary | ICD-10-CM

## 2014-12-13 DIAGNOSIS — R531 Weakness: Secondary | ICD-10-CM | POA: Diagnosis present

## 2014-12-13 DIAGNOSIS — N390 Urinary tract infection, site not specified: Secondary | ICD-10-CM | POA: Insufficient documentation

## 2014-12-13 LAB — CBC
HCT: 37.9 % (ref 36.0–46.0)
HEMOGLOBIN: 13.1 g/dL (ref 12.0–15.0)
MCH: 30.9 pg (ref 26.0–34.0)
MCHC: 34.6 g/dL (ref 30.0–36.0)
MCV: 89.4 fL (ref 78.0–100.0)
PLATELETS: 282 10*3/uL (ref 150–400)
RBC: 4.24 MIL/uL (ref 3.87–5.11)
RDW: 12.6 % (ref 11.5–15.5)
WBC: 7.5 10*3/uL (ref 4.0–10.5)

## 2014-12-13 LAB — IRON AND TIBC CHCC
%SAT: 35 % (ref 21–57)
IRON: 85 ug/dL (ref 41–142)
TIBC: 240 ug/dL (ref 236–444)
UIBC: 156 ug/dL (ref 120–384)

## 2014-12-13 LAB — COMPREHENSIVE METABOLIC PANEL (CC13)
ALT: <6 U/L (ref 0–55)
AST: 20 U/L (ref 5–34)
Albumin: 3.8 g/dL (ref 3.5–5.0)
Alkaline Phosphatase: 55 U/L (ref 40–150)
Anion Gap: 4 mEq/L (ref 3–11)
BUN: 7.7 mg/dL (ref 7.0–26.0)
CALCIUM: 9.2 mg/dL (ref 8.4–10.4)
CO2: 28 mEq/L (ref 22–29)
Chloride: 107 mEq/L (ref 98–109)
Creatinine: 0.8 mg/dL (ref 0.6–1.1)
Glucose: 85 mg/dl (ref 70–140)
Potassium: 4.2 mEq/L (ref 3.5–5.1)
Sodium: 139 mEq/L (ref 136–145)
Total Bilirubin: 0.33 mg/dL (ref 0.20–1.20)
Total Protein: 7.3 g/dL (ref 6.4–8.3)

## 2014-12-13 LAB — CBC & DIFF AND RETIC
BASO%: 0.5 % (ref 0.0–2.0)
BASOS ABS: 0 10*3/uL (ref 0.0–0.1)
EOS ABS: 0.1 10*3/uL (ref 0.0–0.5)
EOS%: 1.5 % (ref 0.0–7.0)
HCT: 38.5 % (ref 34.8–46.6)
HGB: 13.4 g/dL (ref 11.6–15.9)
Immature Retic Fract: 2.9 % (ref 1.60–10.00)
LYMPH%: 25 % (ref 14.0–49.7)
MCH: 31.3 pg (ref 25.1–34.0)
MCHC: 34.8 g/dL (ref 31.5–36.0)
MCV: 90 fL (ref 79.5–101.0)
MONO#: 0.5 10*3/uL (ref 0.1–0.9)
MONO%: 7.2 % (ref 0.0–14.0)
NEUT#: 4.4 10*3/uL (ref 1.5–6.5)
NEUT%: 65.8 % (ref 38.4–76.8)
PLATELETS: 275 10*3/uL (ref 145–400)
RBC: 4.28 10*6/uL (ref 3.70–5.45)
RDW: 12.8 % (ref 11.2–14.5)
RETIC %: 1.95 % (ref 0.70–2.10)
RETIC CT ABS: 83.46 10*3/uL (ref 33.70–90.70)
WBC: 6.7 10*3/uL (ref 3.9–10.3)
lymph#: 1.7 10*3/uL (ref 0.9–3.3)

## 2014-12-13 LAB — BASIC METABOLIC PANEL
ANION GAP: 4 — AB (ref 5–15)
BUN: 8 mg/dL (ref 6–20)
CHLORIDE: 104 mmol/L (ref 101–111)
CO2: 28 mmol/L (ref 22–32)
Calcium: 9 mg/dL (ref 8.9–10.3)
Creatinine, Ser: 0.71 mg/dL (ref 0.44–1.00)
GFR calc non Af Amer: >60 mL/min (ref >60)
Glucose, Bld: 87 mg/dL (ref 65–99)
Potassium: 4.1 mmol/L (ref 3.5–5.1)
SODIUM: 136 mmol/L (ref 135–145)

## 2014-12-13 LAB — I-STAT BETA HCG BLOOD, ED (MC, WL, AP ONLY)

## 2014-12-13 LAB — URINALYSIS, MICROSCOPIC - CHCC
Bilirubin (Urine): NEGATIVE
Glucose: NEGATIVE mg/dL
KETONES: NEGATIVE mg/dL
Nitrite: POSITIVE
PH: 6.5 (ref 4.6–8.0)
Specific Gravity, Urine: 1.015 (ref 1.003–1.035)
UROBILINOGEN UR: 0.2 mg/dL (ref 0.2–1)

## 2014-12-13 LAB — HOLD TUBE, BLOOD BANK

## 2014-12-13 LAB — FERRITIN CHCC: Ferritin: 112 ng/ml (ref 9–269)

## 2014-12-13 LAB — CBG MONITORING, ED: Glucose-Capillary: 74 mg/dL (ref 65–99)

## 2014-12-13 LAB — PREGNANCY, URINE: Preg Test, Ur: NEGATIVE

## 2014-12-13 MED ORDER — CEPHALEXIN 250 MG PO CAPS: 250.0000 mg | ORAL_CAPSULE | Freq: Four times a day (QID) | ORAL | Status: DC

## 2014-12-13 NOTE — ED Notes (Signed)
Bed: JE56 Expected date:  Expected time:  Means of arrival:  Comments: [facility]/chest [facility]

## 2014-12-13 NOTE — Discharge Instructions (Signed)
You were seen today for fatigue. And concern for anemia. Her labs are normal. Please rest and stay hydrated in the heat and please return with any concerns chest pain or fever. Fatigue Fatigue is a feeling of tiredness, lack of energy, lack of motivation, or feeling tired all the time. Having enough rest, good nutrition, and reducing stress will normally reduce fatigue. Consult your caregiver if it persists. The nature of your fatigue will help your caregiver to find out its cause. The treatment is based on the cause.  CAUSES  There are many causes for fatigue. Most of the time, fatigue can be traced to one or more of your habits or routines. Most causes fit into one or more of three general areas. They are: Lifestyle problems  Sleep disturbances.  Overwork.  Physical exertion.  Unhealthy habits.  Poor eating habits or eating disorders.  Alcohol and/or drug use .  Lack of proper nutrition (malnutrition). Psychological problems  Stress and/or anxiety problems.  Depression.  Grief.  Boredom. Medical Problems or Conditions  Anemia.  Pregnancy.  Thyroid gland problems.  Recovery from major surgery.  Continuous pain.  Emphysema or asthma that is not well controlled  Allergic conditions.  Diabetes.  Infections (such as mononucleosis).  Obesity.  Sleep disorders, such as sleep apnea.  Heart failure or other heart-related problems.  Cancer.  Kidney disease.  Liver disease.  Effects of certain medicines such as antihistamines, cough and cold remedies, prescription pain medicines, heart and blood pressure medicines, drugs used for treatment of cancer, and some antidepressants. SYMPTOMS  The symptoms of fatigue include:   Lack of energy.  Lack of drive (motivation).  Drowsiness.  Feeling of indifference to the surroundings. DIAGNOSIS  The details of how you feel help guide your caregiver in finding out what is causing the fatigue. You will be asked about  your present and past health condition. It is important to review all medicines that you take, including prescription and non-prescription items. A thorough exam will be done. You will be questioned about your feelings, habits, and normal lifestyle. Your caregiver may suggest blood tests, urine tests, or other tests to look for common medical causes of fatigue.  TREATMENT  Fatigue is treated by correcting the underlying cause. For example, if you have continuous pain or depression, treating these causes will improve how you feel. Similarly, adjusting the dose of certain medicines will help in reducing fatigue.  HOME CARE INSTRUCTIONS   Try to get the required amount of good sleep every night.  Eat a healthy and nutritious diet, and drink enough water throughout the day.  Practice ways of relaxing (including yoga or meditation).  Exercise regularly.  Make plans to change situations that cause stress. Act on those plans so that stresses decrease over time. Keep your work and personal routine reasonable.  Avoid street drugs and minimize use of alcohol.  Start taking a daily multivitamin after consulting your caregiver. SEEK MEDICAL CARE IF:   You have persistent tiredness, which cannot be accounted for.  You have fever.  You have unintentional weight loss.  You have headaches.  You have disturbed sleep throughout the night.  You are feeling sad.  You have constipation.  You have dry skin.  You have gained weight.  You are taking any new or different medicines that you suspect are causing fatigue.  You are unable to sleep at night.  You develop any unusual swelling of your legs or other parts of your body. [place]  CARE IF:   You are feeling confused.  Your vision is blurred.  You feel faint or pass out.  You develop severe headache.  You develop severe abdominal, pelvic, or back pain.  You develop chest pain, shortness of breath, or an irregular or  fast heartbeat.  You are unable to pass a normal amount of urine.  You develop abnormal bleeding such as bleeding from the rectum or you vomit blood.  You have thoughts about harming yourself or committing suicide.  You are worried that you might harm someone else. MAKE SURE YOU:   Understand these instructions.  Will watch your condition.  Will get help right away if you are not doing well or get worse. Document Released: 03/04/2007 Document Revised: 07/30/2011 Document Reviewed: 09/08/2013 [facility] Patient Information 2015 [place], [place]. This information is not intended to replace advice given to you by your health care provider. Make sure you discuss any questions you have with your health care provider.

## 2014-12-13 NOTE — Assessment & Plan Note (Addendum)
Pt reports nausea, vomiting x 1, chills, diaphoresis, increased fatigue, occ SOB, and intermittent chest pain since Friday 12/10/14.  She actually experienced a near syncopal event while at work on Friday 12/10/14.  She denies any URI, UTI symptoms.   On exam today.-Patient with clear lung sounds bilaterally.  Patient with occasional dry cough; no wheeze, no obvious shortness of breath.  It does appear the patient is taking slightly shallow breaths. She continues to c/o intermittent sharp pain to left chest; and continuous chest pressure.  Vitals remain normal; and pt afebrile.   Labs obtained today reveal stable hemoglobin at 13.4.  Also, urine pregnancy test was negative.  It was noted that patient is positive for urinary tract infection today.  Due to patient's continued complaint of chest pressure and mild shortness of breath-will transport patient to the emergency department for further evaluation and management.  Brief history report were called to the emergency department charge nurse prior to the patient being transported to the emergency department, the a wheelchair.  Per the cancer Center nurse.

## 2014-12-13 NOTE — Assessment & Plan Note (Signed)
Pt reports nausea, vomiting x 1, chills, diaphoresis, increased fatigue, occ SOB, and intermittent chest pain since Friday 12/10/14.  She actually experienced a near syncopal event while at work on Friday 12/10/14.  She denies any URI, UTI symptoms.   On exam today.-Patient with clear lung sounds bilaterally.  Patient with occasional dry cough; no wheeze, no obvious shortness of breath.  It does appear the patient is taking slightly shallow breaths. She continues to c/o intermittent sharp pain to left chest; and continuous chest pressure.  Abdomen soft; bowel sounds positive 4 quads.  No abdominal tenderness or flank pain with palpation.   Vitals remain normal; and pt afebrile.   Labs obtained today reveal stable hemoglobin at 13.4.  Also, urine pregnancy test was negative.  It was noted that patient is positive for urinary tract infection today.  Patient continues to assert that she has no dysuria, frequency, or recent noted hematuria.  She states that she did just finish her period within the last week or so.  Patient is refusing oral,  antibiotics for treatment of her urinary tract infection.  She states that she cannot swallow pills; because they stick in her throat.  She is requesting to receive antibody injection for treatment of her urinary tract infection while in the emergency department today.  She stated that she has frequent urinary tract infections in the past; and has always been able to receive ainjection.  Due to patient's continued complaint of chest pressure and mild shortness of breath-will transport patient to the emergency department for further evaluation and management.  Brief history report were called to the emergency department charge nurse prior to the patient being transported to the emergency department via wheelchair per the [place].  Also, advised the emergency department charge nurse of patient's request for antibiotic injection to treat urinary tract  infection.

## 2014-12-13 NOTE — ED Notes (Signed)
Pt presents to Cancer center for follow up for anemia. Last  Iron transfusion in March. C/o of weakness, near syncope, left side chest pain Friday. No PCP. Pt is seen at Cancer center because of their walk in clinic and has been seen in the past for her established pt. Pt at present is in NAD. Pt states she vomited on Friday however denies N/V/D and fever at present.

## 2014-12-13 NOTE — ED Provider Notes (Signed)
CSN: [id number]     Arrival date & time 12/13/14  1356 History   First MD Initiated Contact with Patient 12/13/14 1438     Chief Complaint  Patient presents with  . Near Syncope  . Chest Pain  . Anemia  . Weakness     (Consider location/radiation/quality/duration/timing/severity/associated sxs/prior Treatment) HPI   Patient is a 34 year old otherwise healthy female presenting today to walk-in with concerns over pain in the neck. Patient has history of iron deficiency anemia due to menorrhagia. She also states she had a chest pain in her left chest on Friday after lifting a box. She's had it occasionally since then mostly when moving. She has had no shortness of breath. No smoker no prolonged car trips.  Past Medical History  Diagnosis Date  . Dysmenorrhea   . 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 Iron deficiency anemia   . Thrombocytosis    History reviewed. No pertinent past surgical history. No family history on file. History  Substance Use Topics  . Smoking status: Former Smoker    Quit date: 03/05/2012  . Smokeless tobacco: Never Used  . Alcohol Use: Yes     Comment: occassionally   OB History    No data available     Review of Systems  Constitutional: Positive for fatigue. Negative for activity change.  HENT: Negative for congestion and drooling.   Eyes: Negative for discharge.  Respiratory: Negative for cough and chest tightness.   Cardiovascular: Positive for chest pain.  Gastrointestinal: Negative for abdominal distention.  Genitourinary: Negative for dysuria and difficulty urinating.  Musculoskeletal: Negative for joint swelling.  Skin: Negative for rash.  Allergic/Immunologic: Negative for immunocompromised state.  Neurological: Negative for seizures and speech difficulty.  Psychiatric/Behavioral: Negative for behavioral problems and agitation.      Allergies  Review of patient's allergies indicates no known allergies.  Home Medications   Prior to  Admission medications   Not on File   BP 132/87 mmHg  Pulse 67  Temp(Src) 98.5 F (36.9 C) (Oral)  Resp 20  SpO2 100%  LMP 12/06/2014 Physical Exam  Constitutional: She is oriented to person, place, and time. She appears well-developed and well-nourished.  HENT:  Head: Normocephalic and atraumatic.  Eyes: Conjunctivae are normal. Right eye exhibits no discharge.  Neck: Neck supple.  Cardiovascular: Normal rate, regular rhythm and normal heart sounds.   No murmur heard. Pulmonary/Chest: Effort normal and breath sounds normal. She has no wheezes. She has no rales.  Tenderness to left chest wall.  Abdominal: Soft. She exhibits no distension. There is no tenderness.  Musculoskeletal: Normal range of motion. She exhibits no edema.  Neurological: She is oriented to person, place, and time. No cranial nerve deficit.  Skin: Skin is warm and dry. No rash noted. She is not diaphoretic.  Psychiatric: She has a normal mood and affect. Her behavior is normal.  Nursing note and vitals reviewed.   ED Course  Procedures (including critical care time) Labs Review Labs Reviewed  BASIC METABOLIC PANEL - Abnormal; Notable for the following:    Anion gap 4 (*)    All other components within normal limits  CBC  CBG MONITORING, ED  I-STAT BETA HCG BLOOD, ED ([facility], [facility], [facility] ONLY)    Imaging Review No results found.   EKG Interpretation   Date/Time:  Monday December 13 2014 14:07:12 EDT Ventricular Rate:  57 PR Interval:  128 QRS Duration: 80 QT Interval:  397 QTC Calculation: 386 R Axis:  70 Text Interpretation:  Sinus rhythm RSR' in V1 or V2, probably normal  variant No acute ischemia No significant change since last tracing  Confirmed by Gerald Leitz ([id number]) on 12/13/2014 3:14:32 PM      MDM   Final diagnoses:  None   patient is a 34 year old female with history of anemia presenting today with concerns over anemia and left-sided chest wall tenderness. Left-sided chest wall  tenderness likely due to moving heavy boxes at work. We will get CBC to check for anemia. Vital signs and physical exam are normal.  EKG reassuring.  Patient is comfortable, ambulatory, and taking PO at time of discharge.  Patient expressed understanding about return precautions.    Baudelia Schroepfer Julio Alm, MD 12/13/14 1829

## 2014-12-13 NOTE — Telephone Encounter (Signed)
"  I need to come in and have my iron level checked today.  Please, if possible."  Observed this phone note and patient has arrived for visit.

## 2014-12-13 NOTE — Telephone Encounter (Signed)
Pt c/o being short winded. She passed out at work on Friday 7/22. No injuries. Pulse ox was 99% and HR was 90. POF for labs and [place].

## 2014-12-13 NOTE — Assessment & Plan Note (Addendum)
Patient has history of chronic iron deficiency anemia.  She last received her last iron infusion in March 2016.  Pt reports nausea, vomiting x 1, chills, diaphoresis, increased fatigue, occ SOB, and intermittent chest pain since Friday 12/10/14.  She actually experienced a near syncopal event while at work on Friday 12/10/14.  She denies any URI, UTI symptoms.   On exam today.-Patient with clear lung sounds bilaterally.  Patient with occasional dry cough; no wheeze, no obvious shortness of breath.  It does appear the patient is taking slightly shallow breaths. She continues to c/o intermittent sharp pain to left chest; and continuous chest pressure.  Vitals remain normal; and pt afebrile.   Labs obtained today reveal stable hemoglobin at 13.4.  WBC 6.7, ANC 4.4, hemoglobin 13.4, platelet 275.  Ferritin level is 112, iron 85, and iron saturation of 35%.  Also, urine pregnancy test was negative.  It was noted that patient is positive for urinary tract infection today.  Due to patient's continued complaint of chest pressure and mild shortness of breath-will transport patient to the emergency department for further evaluation and management.  Brief history report were called to the emergency department charge nurse prior to the patient being transported to the emergency department, the a wheelchair.  Per the cancer Center nurse.  Patient is scheduled to return for labs and 02/04/2015.  Patient is scheduled to return for follow-up visit on 02/11/2015.

## 2014-12-13 NOTE — ED Notes (Signed)
PT STATES SHE UNABLE TO TAKE PILLS BY MOUTH.

## 2014-12-13 NOTE — Progress Notes (Signed)
SYMPTOM MANAGEMENT CLINIC   HPI: Helen Ford 34 y.o. female diagnosed with iron deficiency anemia.  Currently undergoing iron infusions on an as-needed basis.  Pt reports nausea, vomiting x 1, chills, diaphoresis, increased fatigue, occ SOB, and intermittent chest pain since Friday 12/10/14.  She actually experienced a near syncopal event while at work on Friday 12/10/14.  She denies any URI, UTI symptoms .  HPI  ROS  Past Medical History  Diagnosis Date  . Dysmenorrhea   . 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 Iron deficiency anemia   . Thrombocytosis     History reviewed. No pertinent past surgical history.  has Iron deficiency anemia; Thrombocytosis; Dysmenorrhea; Chest pain; and UTI (urinary tract infection) on her problem list.    has No Known Allergies.    Medication List       This list is accurate as of: 12/13/14  2:07 PM.  Always use your most recent med list.               multivitamin tablet  Take 2 tablets by mouth daily. Takes Gummies multivitamin.         PHYSICAL EXAMINATION  Oncology Vitals 12/13/2014 12/13/2014 12/13/2014 12/13/2014 08/20/2014 08/13/2014 05/25/2014  Height - - - - - 155 cm -  Weight - - - 52.073 kg - 52.164 kg -  Weight (lbs) - - - 114 lbs 13 oz - 115 lbs -  BMI (kg/m2) - - - - - 21.73 kg/m2 -  Temp - 98.5 - 98.8 98.4 98.2 98.7  Pulse 64 67 - 74 - 61 82  Resp 16 20 - 16 18 18 16   SpO2 100 100 99 - - 100 -  BSA (m2) - - - - - 1.5 m2 -   BP Readings from Last 3 Encounters:  12/13/14 110/73  12/13/14 120/78  08/20/14 106/66    Physical Exam  Constitutional: She is oriented to person, place, and time and well-developed, well-nourished, and in no distress.  HENT:  Head: Normocephalic and atraumatic.  Mouth/Throat: Oropharynx is clear and moist.  Occasional dry cough only.  Patient noted to be taken slightly shallow breaths.  Eyes: Conjunctivae and EOM are normal. Pupils are equal, round, and reactive to light. Right eye exhibits  no discharge. Left eye exhibits no discharge. No scleral icterus.  Neck: Normal range of motion. Neck supple. No JVD present. No tracheal deviation present. No thyromegaly present.  Cardiovascular: Normal rate, regular rhythm, normal heart sounds and intact distal pulses.   Pulmonary/Chest: Effort normal and breath sounds normal. No stridor. No respiratory distress. She has no wheezes. She has no rales. She exhibits no tenderness.  Occ dry cough.  Slightly shallow breaths.   Abdominal: Soft. Bowel sounds are normal. She exhibits no distension and no mass. There is no tenderness. There is no rebound and no guarding.  Musculoskeletal: Normal range of motion. She exhibits no edema or tenderness.  Lymphadenopathy:    She has no cervical adenopathy.  Neurological: She is alert and oriented to person, place, and time. Gait normal.  Skin: Skin is warm and dry. No rash noted. No erythema. No pallor.  Psychiatric: Affect normal.  Nursing note and vitals reviewed.   LABORATORY DATA:. Admission on 12/13/2014, Discharged on 12/13/2014  Component Date Value Ref Range Status  . Sodium 12/13/2014 136  135 - 145 mmol/L Final  . Potassium 12/13/2014 4.1  3.5 - 5.1 mmol/L Final  . Chloride 12/13/2014 104  101 - 111 mmol/L  Final  . CO2 12/13/2014 28  22 - 32 mmol/L Final  . Glucose, Bld 12/13/2014 87  65 - 99 mg/dL Final  . BUN 12/13/2014 8  6 - 20 mg/dL Final  . Creatinine, Ser 12/13/2014 0.71  0.44 - 1.00 mg/dL Final  . Calcium 12/13/2014 9.0  8.9 - 10.3 mg/dL Final  . GFR calc non Af Amer 12/13/2014 >60  >60 mL/min Final  . GFR calc Af Amer 12/13/2014 >60  >60 mL/min Final   Comment: (NOTE) The eGFR has been calculated using the CKD EPI equation. This calculation has not been validated in all clinical situations. eGFR's persistently <60 mL/min signify possible Chronic Kidney Disease.   . Anion gap 12/13/2014 4* 5 - 15 Final  . WBC 12/13/2014 7.5  4.0 - 10.5 K/uL Final  . RBC 12/13/2014 4.24  3.87  - 5.11 MIL/uL Final  . Hemoglobin 12/13/2014 13.1  12.0 - 15.0 g/dL Final  . HCT 12/13/2014 37.9  36.0 - 46.0 % Final  . MCV 12/13/2014 89.4  78.0 - 100.0 fL Final  . MCH 12/13/2014 30.9  26.0 - 34.0 pg Final  . MCHC 12/13/2014 34.6  30.0 - 36.0 g/dL Final  . RDW 12/13/2014 12.6  11.5 - 15.5 % Final  . Platelets 12/13/2014 282  150 - 400 K/uL Final  . Glucose-Capillary 12/13/2014 74  65 - 99 mg/dL Final  . I-stat hCG, quantitative 12/13/2014 <5.0  <5 mIU/mL Final  . Comment 3 12/13/2014          Final   Comment:   GEST. AGE      CONC.  (mIU/mL)   <=1 WEEK        5 - 50     2 WEEKS       50 - 500     3 WEEKS       100 - 10,000     4 WEEKS     1,000 - 30,000        FEMALE AND NON-PREGNANT FEMALE:     LESS THAN 5 mIU/mL   Hospital Outpatient Visit on 12/13/2014  Component Date Value Ref Range Status  . Preg Test, Ur 12/13/2014 NEGATIVE  NEGATIVE Final   Comment:        THE SENSITIVITY OF THIS METHODOLOGY IS >20 mIU/mL.   Appointment on 12/13/2014  Component Date Value Ref Range Status  . WBC 12/13/2014 6.7  3.9 - 10.3 10e3/uL Final  . NEUT# 12/13/2014 4.4  1.5 - 6.5 10e3/uL Final  . HGB 12/13/2014 13.4  11.6 - 15.9 g/dL Final  . HCT 12/13/2014 38.5  34.8 - 46.6 % Final  . Platelets 12/13/2014 275  145 - 400 10e3/uL Final  . MCV 12/13/2014 90.0  79.5 - 101.0 fL Final  . MCH 12/13/2014 31.3  25.1 - 34.0 pg Final  . MCHC 12/13/2014 34.8  31.5 - 36.0 g/dL Final  . RBC 12/13/2014 4.28  3.70 - 5.45 10e6/uL Final  . RDW 12/13/2014 12.8  11.2 - 14.5 % Final  . lymph# 12/13/2014 1.7  0.9 - 3.3 10e3/uL Final  . MONO# 12/13/2014 0.5  0.1 - 0.9 10e3/uL Final  . Eosinophils Absolute 12/13/2014 0.1  0.0 - 0.5 10e3/uL Final  . Basophils Absolute 12/13/2014 0.0  0.0 - 0.1 10e3/uL Final  . NEUT% 12/13/2014 65.8  38.4 - 76.8 % Final  . LYMPH% 12/13/2014 25.0  14.0 - 49.7 % Final  . MONO% 12/13/2014 7.2  0.0 - 14.0 % Final  .  EOS% 12/13/2014 1.5  0.0 - 7.0 % Final  . BASO% 12/13/2014 0.5  0.0  - 2.0 % Final  . Retic % 12/13/2014 1.95  0.70 - 2.10 % Final  . Retic Ct Abs 12/13/2014 83.46  33.70 - 90.70 10e3/uL Final  . Immature Retic Fract 12/13/2014 2.90  1.60 - 10.00 % Final  . Sodium 12/13/2014 139  136 - 145 mEq/L Final  . Potassium 12/13/2014 4.2  3.5 - 5.1 mEq/L Final  . Chloride 12/13/2014 107  98 - 109 mEq/L Final  . CO2 12/13/2014 28  22 - 29 mEq/L Final  . Glucose 12/13/2014 85  70 - 140 mg/dl Final  . BUN 12/13/2014 7.7  7.0 - 26.0 mg/dL Final  . Creatinine 12/13/2014 0.8  0.6 - 1.1 mg/dL Final  . Total Bilirubin 12/13/2014 0.33  0.20 - 1.20 mg/dL Final  . Alkaline Phosphatase 12/13/2014 55  40 - 150 U/L Final  . AST 12/13/2014 20  5 - 34 U/L Final  . ALT 12/13/2014 <6  0 - 55 U/L Final  . Total Protein 12/13/2014 7.3  6.4 - 8.3 g/dL Final  . Albumin 12/13/2014 3.8  3.5 - 5.0 g/dL Final  . Calcium 12/13/2014 9.2  8.4 - 10.4 mg/dL Final  . Anion Gap 12/13/2014 4  3 - 11 mEq/L Final  . EGFR 12/13/2014 >90  >90 ml/min/1.73 m2 Final   eGFR is calculated using the CKD-EPI Creatinine Equation (2009)  . Hold Tube, Blood Bank 12/13/2014 Blood Bank Order Cancelled   Final  . Ferritin 12/13/2014 112  9 - 269 ng/ml Final  . Iron 12/13/2014 85  41 - 142 ug/dL Final  . TIBC 12/13/2014 240  236 - 444 ug/dL Final  . UIBC 12/13/2014 156  120 - 384 ug/dL Final  . %SAT 12/13/2014 35  21 - 57 % Final  . Glucose 12/13/2014 Negative  Negative mg/dL Final  . Bilirubin (Urine) 12/13/2014 Negative  Negative Final  . Ketones 12/13/2014 Negative  Negative mg/dL Final  . Specific Gravity, Urine 12/13/2014 1.015  1.003 - 1.035 Final  . Blood 12/13/2014 Trace  Negative Final  . pH 12/13/2014 6.5  4.6 - 8.0 Final  . Protein 12/13/2014 < 30  Negative- <30 mg/dL Final  . Urobilinogen, UR 12/13/2014 0.2  0.2 - 1 mg/dL Final  . Nitrite 12/13/2014 Positive  Negative Final  . Leukocyte Esterase 12/13/2014 Moderate  Negative Final  . RBC / HPF 12/13/2014 3-6  0 - 2 Final  . WBC, UA 12/13/2014  21-50  0 - 2 Final  . Bacteria, UA 12/13/2014 Many  Negative- Trace Final  . Epithelial Cells 12/13/2014 Few  Negative- Few Final     RADIOGRAPHIC STUDIES: No results found.  ASSESSMENT/PLAN:    Chest pain Pt reports nausea, vomiting x 1, chills, diaphoresis, increased fatigue, occ SOB, and intermittent chest pain since Friday 12/10/14.  She actually experienced a near syncopal event while at work on Friday 12/10/14.  She denies any URI, UTI symptoms.   On exam today.-Patient with clear lung sounds bilaterally.  Patient with occasional dry cough; no wheeze, no obvious shortness of breath.  It does appear the patient is taking slightly shallow breaths. She continues to c/o intermittent sharp pain to left chest; and continuous chest pressure.  Vitals remain normal; and pt afebrile.   Labs obtained today reveal stable hemoglobin at 13.4.  Also, urine pregnancy test was negative.  It was noted that patient is positive for urinary tract infection today.  Due to patient's continued complaint of chest pressure and mild shortness of breath-will transport patient to the emergency department for further evaluation and management.  Brief history report were called to the emergency department charge nurse prior to the patient being transported to the emergency department, the a wheelchair.  Per the cancer Center nurse.    Iron deficiency anemia Patient has history of chronic iron deficiency anemia.  She last received her last iron infusion in March 2016.  Pt reports nausea, vomiting x 1, chills, diaphoresis, increased fatigue, occ SOB, and intermittent chest pain since Friday 12/10/14.  She actually experienced a near syncopal event while at work on Friday 12/10/14.  She denies any URI, UTI symptoms.   On exam today.-Patient with clear lung sounds bilaterally.  Patient with occasional dry cough; no wheeze, no obvious shortness of breath.  It does appear the patient is taking slightly shallow breaths. She  continues to c/o intermittent sharp pain to left chest; and continuous chest pressure.  Vitals remain normal; and pt afebrile.   Labs obtained today reveal stable hemoglobin at 13.4.  WBC 6.7, ANC 4.4, hemoglobin 13.4, platelet 275.  Ferritin level is 112, iron 85, and iron saturation of 35%.  Also, urine pregnancy test was negative.  It was noted that patient is positive for urinary tract infection today.  Due to patient's continued complaint of chest pressure and mild shortness of breath-will transport patient to the emergency department for further evaluation and management.  Brief history report were called to the emergency department charge nurse prior to the patient being transported to the emergency department, the a wheelchair.  Per the cancer Center nurse.  Patient is scheduled to return for labs and 02/04/2015.  Patient is scheduled to return for follow-up visit on 02/11/2015.     UTI (urinary tract infection) Pt reports nausea, vomiting x 1, chills, diaphoresis, increased fatigue, occ SOB, and intermittent chest pain since Friday 12/10/14.  She actually experienced a near syncopal event while at work on Friday 12/10/14.  She denies any URI, UTI symptoms.   On exam today.-Patient with clear lung sounds bilaterally.  Patient with occasional dry cough; no wheeze, no obvious shortness of breath.  It does appear the patient is taking slightly shallow breaths. She continues to c/o intermittent sharp pain to left chest; and continuous chest pressure.  Abdomen soft; bowel sounds positive 4 quads.  No abdominal tenderness or flank pain with palpation.   Vitals remain normal; and pt afebrile.   Labs obtained today reveal stable hemoglobin at 13.4.  Also, urine pregnancy test was negative.  It was noted that patient is positive for urinary tract infection today.  Patient continues to assert that she has no dysuria, frequency, or recent noted hematuria.  She states that she did just finish her  period within the last week or so.  Patient is refusing oral,  antibiotics for treatment of her urinary tract infection.  She states that she cannot swallow pills; because they stick in her throat.  She is requesting to receive antibody injection for treatment of her urinary tract infection while in the emergency department today.  She stated that she has frequent urinary tract infections in the past; and has always been able to receive ainjection.  Due to patient's continued complaint of chest pressure and mild shortness of breath-will transport patient to the emergency department for further evaluation and management.  Brief history report were called to the emergency department charge nurse prior to the patient being  transported to the emergency department via wheelchair per the [facility].  Also, advised the emergency department charge nurse of patient's request for antibiotic injection to treat urinary tract infection.     Patient stated understanding of all instructions; and was in agreement with this plan of care. The patient knows to call the clinic with any problems, questions or concerns.   Review/collaboration with Dr. Burr Medico regarding all aspects of patient's visit today.   Total time spent with patient was 40 minutes;  with greater than 75 percent of that time spent in face to face counseling regarding patient's symptoms,  and coordination of care and follow up.  Disclaimer: This note was dictated with voice recognition software. Similar sounding words can inadvertently be transcribed and may not be corrected upon review.   Drue Second, NP 12/13/2014

## 2014-12-15 ENCOUNTER — Telehealth: Payer: Self-pay | Admitting: Nurse Practitioner

## 2014-12-15 LAB — URINE CULTURE

## 2014-12-15 NOTE — Telephone Encounter (Signed)
Patient was diagnosed with urinary tract infection this past Monday, 12/13/2014.  She was sent to the emergency department for complaint of chest discomfort.  She refused oral antibiotic prescription for treatment of urinary tract infection while at the cancer center.  She requested an antibiotic injection for the UTI diagnosis while in the emergency department.  Called patient to review her urine culture results with her today.  She states that she was given Keflex capsule anti-biotic's for treatment of urinary tract infection.  She states that she is able to take the capsules and open them up and take them with some applesauce.  She states her symptoms are slowly resolving.  She denies any worsening symptoms whatsoever.  Chest denies any recent fevers or chills.  Advised patient to call/return or go back to the emergency department for any worsening symptoms whatsoever.

## 2015-02-02 ENCOUNTER — Ambulatory Visit (INDEPENDENT_AMBULATORY_CARE_PROVIDER_SITE_OTHER): Payer: BLUE CROSS/BLUE SHIELD | Admitting: Emergency Medicine

## 2015-02-02 ENCOUNTER — Telehealth: Payer: Self-pay | Admitting: *Deleted

## 2015-02-02 ENCOUNTER — Other Ambulatory Visit: Payer: Self-pay | Admitting: Hematology

## 2015-02-02 VITALS — BP 106/70 | HR 70 | Temp 98.3°F | Resp 16 | Ht 62.0 in | Wt 115.0 lb

## 2015-02-02 DIAGNOSIS — J0140 Acute pansinusitis, unspecified: Secondary | ICD-10-CM | POA: Diagnosis not present

## 2015-02-02 MED ORDER — AMOXICILLIN 400 MG/5ML PO SUSR: 800.0000 mg | Freq: Two times a day (BID) | ORAL | Status: DC

## 2015-02-02 NOTE — Telephone Encounter (Signed)
Received message from on call nurse after hours re:  Pt called wanting to come in for lab appt since pt felt like her iron is low.   Noted pt already has lab appt for 02/04/15.   Called pt and left message on voice mail informing pt of scheduled lab appt on 02/04/15. Pt's   Phone     [phone].

## 2015-02-02 NOTE — Patient Instructions (Signed)

## 2015-02-02 NOTE — Progress Notes (Signed)
Subjective:  Patient ID: Helen Ford, female    DOB: 08-13-1980  Age: 34 y.o. MRN: [id number]  CC: Sinusitis; Cough; Generalized Body Aches; and Sore Throat   HPI Helen Ford presents  with nasal congestion postnasal drainage over a week.. He has a purulent postnasal drainage and a cough productive of purulent sputum. She has no fever or chills. She sore throat. She has myalgias or arthralgias. She had no nausea vomiting or stool change. No wheezing or shortness of breath. No improvement with over-the-counter medication  History Helen Ford has a past medical history of Dysmenorrhea; Inability to conceive, female; Iron deficiency anemia; and Thrombocytosis.   She has no past surgical history on file.   Helen Ford  family history is not on file.  She   reports that she quit smoking about 2 years ago. She has never used smokeless tobacco. She reports that she drinks alcohol. She reports that she does not use illicit drugs.  Outpatient Prescriptions Prior to Visit  Medication Sig Dispense Refill  . cephALEXin (KEFLEX) 250 MG capsule Take 1 capsule (250 mg total) by mouth 4 (four) times daily. (Patient not taking: Reported on 02/02/2015) 28 capsule 0   No facility-administered medications prior to visit.    Social History   Social History  . Marital Status: Single    Spouse Name: N/A  . Number of Children: 0  . Years of Education: N/A   Occupational History  .      lifting,ware house.    Social History Main Topics  . Smoking status: Former Smoker    Quit date: 03/05/2012  . Smokeless tobacco: Never Used  . Alcohol Use: Yes     Comment: occassionally  . Drug Use: No  . Sexual Activity: Not Asked   Other Topics Concern  . None   Social History Narrative     Review of Systems  Constitutional: Negative for fever, chills and appetite change.  HENT: Positive for congestion, postnasal drip and sinus pressure. Negative for ear pain and sore throat.   Eyes: Negative for pain  and redness.  Respiratory: Positive for cough. Negative for shortness of breath and wheezing.   Cardiovascular: Negative for leg swelling.  Gastrointestinal: Negative for nausea, vomiting, abdominal pain, diarrhea, constipation and blood in stool.  Endocrine: Negative for polyuria.  Genitourinary: Negative for dysuria, urgency, frequency and flank pain.  Musculoskeletal: Negative for gait problem.  Skin: Negative for rash.  Neurological: Negative for weakness and headaches.  Psychiatric/Behavioral: Negative for confusion and decreased concentration. The patient is not nervous/anxious.     Objective:  BP 106/70 mmHg  Pulse 70  Temp(Src) 98.3 F (36.8 C) (Oral)  Resp 16  Ht 5\' 2"  (1.575 m)  Wt 115 lb (52.164 kg)  BMI 21.03 kg/m2  SpO2 99%  LMP 01/21/2015  Physical Exam  Constitutional: She is oriented to person, place, and time. She appears well-developed and well-nourished. No distress.  HENT:  Head: Normocephalic and atraumatic.  Right Ear: External ear normal.  Left Ear: External ear normal.  Nose: Nose normal.  Eyes: Conjunctivae and EOM are normal. Pupils are equal, round, and reactive to light. No scleral icterus.  Neck: Normal range of motion. Neck supple. No tracheal deviation present.  Cardiovascular: Normal rate, regular rhythm and normal heart sounds.   Pulmonary/Chest: Effort normal. No respiratory distress. She has no wheezes. She has no rales.  Abdominal: She exhibits no mass. There is no tenderness. There is no rebound and no guarding.  Musculoskeletal:  She exhibits no edema.  Lymphadenopathy:    She has no cervical adenopathy.  Neurological: She is alert and oriented to person, place, and time. Coordination normal.  Skin: Skin is warm and dry. No rash noted.  Psychiatric: She has a normal mood and affect. Helen Ford behavior is normal.      Assessment & Plan:   Helen Ford was seen today for sinusitis, cough, generalized body aches and sore throat.  Diagnoses and  all orders for this visit:  Acute pansinusitis, recurrence not specified  Other orders -     amoxicillin (AMOXIL) 400 MG/5ML suspension; Take 10 mLs (800 mg total) by mouth 2 (two) times daily.  I am having Helen Ford start on amoxicillin. I am also having Helen Ford maintain Helen Ford cephALEXin.  Meds ordered this encounter  Medications  . amoxicillin (AMOXIL) 400 MG/5ML suspension    Sig: Take 10 mLs (800 mg total) by mouth 2 (two) times daily.    Dispense:  200 mL    Refill:  0    Appropriate red flag conditions were discussed with the patient as well as actions that should be taken.  Patient expressed his understanding.  Follow-up: Return if symptoms worsen or fail to improve.  Roselee Culver, MD

## 2015-02-04 ENCOUNTER — Other Ambulatory Visit: Payer: BLUE CROSS/BLUE SHIELD

## 2015-02-11 ENCOUNTER — Encounter: Payer: Self-pay | Admitting: Hematology

## 2015-02-11 ENCOUNTER — Ambulatory Visit (HOSPITAL_BASED_OUTPATIENT_CLINIC_OR_DEPARTMENT_OTHER): Payer: BLUE CROSS/BLUE SHIELD | Admitting: Hematology

## 2015-02-11 ENCOUNTER — Telehealth: Payer: Self-pay | Admitting: Hematology

## 2015-02-11 VITALS — BP 110/71 | HR 82 | Temp 98.8°F | Resp 18 | Ht 62.0 in | Wt 114.8 lb

## 2015-02-11 DIAGNOSIS — D509 Iron deficiency anemia, unspecified: Secondary | ICD-10-CM | POA: Diagnosis not present

## 2015-02-11 NOTE — Telephone Encounter (Signed)
Gave and printed appt sched and avs for pt for OCT/Jan and April

## 2015-02-11 NOTE — Progress Notes (Signed)
[facility] OFFICE PROGRESS NOTE  Adam Phenix, MD [address], [place] [address]  DIAGNOSIS: Iron deficiency anemia   CURRENT THERAPY: Feraheme as need, 1-2 times a year, last in 07/2014   INTERVAL HISTORY: Helen Ford 34 y.o. female with a history of iron-deficiency anemia secondary to dysmenorrhea is here for follow-up.  She is doing well overall. She still has very heavy period every 21 days, she has been trying to get pregnancy and has not been successful. She does follow-up with her gynecologist. No other signs of bleeding. Her last Feraheme infusion was in March, and she did feel better afterwards. No other new complaints.  MEDICAL HISTORY: Past Medical History  Diagnosis Date  . Dysmenorrhea   . 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 Iron deficiency anemia   . Thrombocytosis    INTERIM HISTORY: has Iron deficiency anemia; Thrombocytosis; Dysmenorrhea; Chest pain; and UTI (urinary tract infection) on her problem list.    ALLERGIES:  has No Known Allergies.  MEDICATIONS: has a current medication list which includes the following prescription(s): multivitamin gummies adults.  SURGICAL HISTORY: History reviewed. No pertinent past surgical history.  REVIEW OF SYSTEMS:   Constitutional: Denies fevers, chills or abnormal weight loss Eyes: Denies blurriness of vision Ears, nose, mouth, throat, and face: Denies mucositis or sore throat Respiratory: Denies cough, dyspnea or wheezes Cardiovascular: Denies palpitation, chest discomfort or lower extremity swelling Gastrointestinal:  Denies nausea, heartburn or change in bowel habits Skin: Denies abnormal skin rashes Lymphatics: Denies new lymphadenopathy or easy bruising Neurological:Denies numbness, tingling or new weaknesses Behavioral/Psych: Mood is stable, no new changes  All other systems were reviewed with the patient and are negative.  PHYSICAL EXAMINATION: ECOG PERFORMANCE STATUS: 0 -  Asymptomatic  Blood pressure 110/71, pulse 82, temperature 98.8 F (37.1 C), temperature source Oral, resp. rate 18, height 5\' 2"  (1.575 m), weight 114 lb 12.8 oz (52.073 kg), last menstrual period 01/21/2015, SpO2 100 %.  GENERAL:alert, no distress and comfortable; well developed, well noursished.  SKIN: skin color, texture, turgor are normal, no rashes or significant lesions EYES: normal, Conjunctiva are pink and non-injected, sclera clear OROPHARYNX:no exudate, no erythema and lips, buccal mucosa, and tongue normal  NECK: supple, thyroid normal size, non-tender, without nodularity LYMPH:  no palpable lymphadenopathy in the cervical, axillary or supraclavicular LUNGS: clear to auscultation and percussion with normal breathing effort HEART: regular rate & rhythm and no murmurs and no lower extremity edema ABDOMEN:abdomen soft, non-tender and normal bowel sounds Musculoskeletal:no cyanosis of digits and no clubbing  NEURO: alert & oriented x 3 with fluent speech, no focal motor/sensory deficits  LABORATORY DATA: CBC Latest Ref Rng 12/13/2014 12/13/2014 11/15/2014  WBC 4.0 - 10.5 K/uL 7.5 6.7 9.7  Hemoglobin 12.0 - 15.0 g/dL 13.1 13.4 12.9  Hematocrit 36.0 - 46.0 % 37.9 38.5 36.0  Platelets 150 - 400 K/uL 282 275 294   CMP Latest Ref Rng 12/13/2014 12/13/2014 08/13/2014  Glucose 65 - 99 mg/dL 87 85 79  BUN 6 - 20 mg/dL 8 7.7 10.2  Creatinine 0.44 - 1.00 mg/dL 0.71 0.8 0.8  Sodium 135 - 145 mmol/L 136 139 138  Potassium 3.5 - 5.1 mmol/L 4.1 4.2 4.1  Chloride 101 - 111 mmol/L 104 - -  CO2 22 - 32 mmol/L 28 28 22   Calcium 8.9 - 10.3 mg/dL 9.0 9.2 9.3  Total Protein 6.4 - 8.3 g/dL 7.3 - 7.6  Total Bilirubin 0.20 - 1.20 mg/dL 0.33 -  0.44  Alkaline Phos 40 - 150 U/L 55 - 55  AST 5 - 34 U/L 20 - 16  ALT 0 - 55 U/L <6 - 6   Results for Helen, Ford (MRN [id number]) as of 02/11/2015 15:27  Ref. Range 12/13/2014 12:30  Iron Latest Ref Range: 41-142 ug/dL 85  UIBC Latest Ref Range: 120-384  ug/dL 156  TIBC Latest Ref Range: 236-444 ug/dL 240  %SAT Latest Ref Range: 21-57 % 35  Ferritin Latest Ref Range: 9-269 ng/ml 112    RADIOGRAPHIC STUDIES: No results found.  ASSESSMENT: GIULIETTA Ford 34 y.o. female with a history of Iron deficiency anemia  PLAN:   1. Iron deficiency anemia secondary #2.  -She is clinically doing well, ferritin was 112 2 months ago. -We'll follow her CBC and iron studies every 3-4 months -Consider IV Feraheme if her ferritin drops below 50 -She does not tolerate by mouth iron.    2. Menorrhagia. --She will follow-up with her gyenecology as needed.   OCPs did not work in the past, and she is to try to get pregnancy.  Plan -Repeat lab in 4 months, and then every 3 months afterwards -I'll see him back in several months   All questions were answered. The patient knows to call the clinic with any problems, questions or concerns. We can certainly see the patient much sooner if necessary.  I spent 10 minutes counseling the patient face to face. The total time spent in the appointment was 15 minutes.  Truitt Merle, MD 02/11/2015 3:25 PM

## 2015-03-11 ENCOUNTER — Other Ambulatory Visit (HOSPITAL_COMMUNITY)
Admission: AD | Admit: 2015-03-11 | Discharge: 2015-03-11 | Disposition: A | Discharge status: Home / Self Care | Payer: BLUE CROSS/BLUE SHIELD | Source: Ambulatory Visit | Attending: Nurse Practitioner | Admitting: Nurse Practitioner

## 2015-03-11 ENCOUNTER — Other Ambulatory Visit (HOSPITAL_BASED_OUTPATIENT_CLINIC_OR_DEPARTMENT_OTHER): Payer: BLUE CROSS/BLUE SHIELD

## 2015-03-11 DIAGNOSIS — D509 Iron deficiency anemia, unspecified: Secondary | ICD-10-CM

## 2015-03-11 LAB — CBC & DIFF AND RETIC
BASO%: 0.2 % (ref 0.0–2.0)
BASOS ABS: 0 10*3/uL (ref 0.0–0.1)
EOS%: 0.9 % (ref 0.0–7.0)
Eosinophils Absolute: 0.1 10*3/uL (ref 0.0–0.5)
HCT: 38.6 % (ref 34.8–46.6)
HGB: 13.5 g/dL (ref 11.6–15.9)
Immature Retic Fract: 3.8 % (ref 1.60–10.00)
LYMPH%: 19.1 % (ref 14.0–49.7)
MCH: 30.8 pg (ref 25.1–34.0)
MCHC: 35 g/dL (ref 31.5–36.0)
MCV: 88.1 fL (ref 79.5–101.0)
MONO#: 0.7 10*3/uL (ref 0.1–0.9)
MONO%: 6.8 % (ref 0.0–14.0)
NEUT#: 7.8 10*3/uL — ABNORMAL HIGH (ref 1.5–6.5)
NEUT%: 73 % (ref 38.4–76.8)
PLATELETS: 284 10*3/uL (ref 145–400)
RBC: 4.38 10*6/uL (ref 3.70–5.45)
RDW: 12.6 % (ref 11.2–14.5)
Retic %: 1.7 % (ref 0.70–2.10)
Retic Ct Abs: 74.46 10*3/uL (ref 33.70–90.70)
WBC: 10.7 10*3/uL — ABNORMAL HIGH (ref 3.9–10.3)
lymph#: 2 10*3/uL (ref 0.9–3.3)

## 2015-03-11 LAB — PREGNANCY, URINE: Preg Test, Ur: NEGATIVE

## 2015-03-13 ENCOUNTER — Other Ambulatory Visit: Payer: Self-pay | Admitting: Hematology

## 2015-03-14 ENCOUNTER — Other Ambulatory Visit: Payer: Self-pay | Admitting: Hematology

## 2015-03-14 LAB — FERRITIN CHCC: FERRITIN: 24 ng/mL (ref 9–269)

## 2015-03-15 ENCOUNTER — Telehealth: Payer: Self-pay | Admitting: Hematology

## 2015-03-15 ENCOUNTER — Telehealth: Payer: Self-pay | Admitting: *Deleted

## 2015-03-15 NOTE — Telephone Encounter (Signed)
per pof to sch pt appt-per reply from MW -cld and spoke to pt and gave pt time & date of appt time on 11/1-adv to get updated copy of sch

## 2015-03-15 NOTE — Telephone Encounter (Signed)
Per staff message and POF I have scheduled appts. Advised scheduler of appts. JMW  

## 2015-03-15 NOTE — Telephone Encounter (Signed)
per pof to sch pt fera-sent MW email to sch-will call pt after reply

## 2015-03-22 ENCOUNTER — Ambulatory Visit: Payer: BLUE CROSS/BLUE SHIELD

## 2015-03-29 ENCOUNTER — Ambulatory Visit: Payer: BLUE CROSS/BLUE SHIELD

## 2015-05-05 ENCOUNTER — Other Ambulatory Visit: Payer: Self-pay | Admitting: Nurse Practitioner

## 2015-06-03 ENCOUNTER — Other Ambulatory Visit: Payer: BLUE CROSS/BLUE SHIELD

## 2015-06-19 ENCOUNTER — Encounter (HOSPITAL_COMMUNITY): Payer: Self-pay

## 2015-06-19 ENCOUNTER — Emergency Department (HOSPITAL_COMMUNITY)
Admission: EM | Admit: 2015-06-19 | Discharge: 2015-06-19 | Disposition: A | Discharge status: Home / Self Care | Payer: BLUE CROSS/BLUE SHIELD | Attending: Emergency Medicine | Admitting: Emergency Medicine

## 2015-06-19 DIAGNOSIS — Z862 Personal history of diseases of the blood and blood-forming organs and certain disorders involving the immune mechanism: Secondary | ICD-10-CM | POA: Insufficient documentation

## 2015-06-19 DIAGNOSIS — Z87891 Personal history of nicotine dependence: Secondary | ICD-10-CM | POA: Insufficient documentation

## 2015-06-19 DIAGNOSIS — W25XXXA Contact with sharp glass, initial encounter: Secondary | ICD-10-CM | POA: Insufficient documentation

## 2015-06-19 DIAGNOSIS — Y9289 Other specified places as the place of occurrence of the external cause: Secondary | ICD-10-CM | POA: Insufficient documentation

## 2015-06-19 DIAGNOSIS — Z8742 Personal history of other diseases of the female genital tract: Secondary | ICD-10-CM | POA: Insufficient documentation

## 2015-06-19 DIAGNOSIS — Y9389 Activity, other specified: Secondary | ICD-10-CM | POA: Insufficient documentation

## 2015-06-19 DIAGNOSIS — S41112A Laceration without foreign body of left upper arm, initial encounter: Secondary | ICD-10-CM

## 2015-06-19 DIAGNOSIS — S51811A Laceration without foreign body of right forearm, initial encounter: Secondary | ICD-10-CM | POA: Insufficient documentation

## 2015-06-19 DIAGNOSIS — Y998 Other external cause status: Secondary | ICD-10-CM | POA: Insufficient documentation

## 2015-06-19 MED ORDER — LIDOCAINE-EPINEPHRINE (PF) 2 %-1:200000 IJ SOLN
INTRAMUSCULAR | Status: AC
  Administered 2015-06-19: 05:00:00
  Filled 2015-06-19: qty 20

## 2015-06-19 MED ORDER — LIDOCAINE-EPINEPHRINE (PF) 2 %-1:200000 IJ SOLN
10.0000 mL | Freq: Once | INTRAMUSCULAR | Status: AC
  Administered 2015-06-19: 10 mL

## 2015-06-19 NOTE — ED Notes (Signed)
Pt cut her arm with glass, bleeding controlled

## 2015-06-19 NOTE — ED Provider Notes (Signed)
LACERATION REPAIR Performed by: Charlann Lange A Authorized by: Charlann Lange A Consent: Verbal consent obtained. Risks and benefits: risks, benefits and alternatives were discussed Consent given by: patient Patient identity confirmed: provided demographic data Prepped and Draped in normal sterile fashion Wound explored  Laceration Location: right forearm  Laceration Length: 3 cm  No Foreign Bodies seen or palpated  Anesthesia: local infiltration  Local anesthetic: lidocaine 2% w/epinephrine  Anesthetic total: 2 ml  Irrigation method: syringe Amount of cleaning: standard  Skin closure: staples  Number of sutures: 5 staples  Technique: staples  Patient tolerance: Patient tolerated the procedure well with no immediate complications.   Charlann Lange, PA-C 06/19/15 0505  Dorie Rank, MD 06/19/15 [phone]

## 2015-06-19 NOTE — ED Notes (Signed)
Bed: WA05 Expected date:  Expected time:  Means of arrival:  Comments: 

## 2015-06-19 NOTE — ED Provider Notes (Signed)
CSN: [id number]     Arrival date & time 06/19/15  0307 History  By signing my name below, I, Dora Sims, attest that this documentation has been prepared under the direction and in the presence of Dorie Rank, MD . Electronically Signed: Dora Sims, Scribe. 06/19/2015. 3:33 AM.   Chief Complaint  Patient presents with  . Extremity Laceration     Patient is a 35 y.o. female presenting with skin laceration. The history is provided by the patient. No language interpreter was used.  Laceration Location:  Shoulder/arm Shoulder/arm laceration location:  R forearm Bleeding: controlled   Time since incident:  2 hours Laceration mechanism:  Broken glass Pain details:    Quality:  Unable to specify   Severity:  Unable to specify   Timing:  Unable to specify   Progression:  Unable to specify Relieved by:  None tried Worsened by:  Nothing tried Ineffective treatments:  None tried Tetanus status:  Up to date    HPI Comments: Helen Ford is a 35 y.o. female who presents to the Emergency Department here after a laceration to her lower left arm sustained earlier tonight. She states that she cut herself with a piece of glass from her window. Her bleeding is controlled at this time. Pt also reports mild pain to the area. Tetanus immunization is UTD. She denies any other injuries or any other associated symptoms at this time.   Past Medical History  Diagnosis Date  . Dysmenorrhea   . 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 Iron deficiency anemia   . Thrombocytosis ([place])    History reviewed. No pertinent past surgical history. History reviewed. No pertinent family history. Social History  Substance Use Topics  . Smoking status: Former Smoker    Quit date: 03/05/2012  . Smokeless tobacco: Never Used  . Alcohol Use: Yes     Comment: occassionally   OB History    No data available     Review of Systems  Constitutional: Negative for fever and chills.  Skin: Positive for wound.   Neurological: Negative for numbness.  Psychiatric/Behavioral: Negative for self-injury.  All other systems reviewed and are negative.     Allergies  Review of patient's allergies indicates no known allergies.  Home Medications   Prior to Admission medications   Not on File   BP 122/96 mmHg  Pulse 84  Temp(Src) 97.7 F (36.5 C) (Oral)  Resp 17  SpO2 100%  LMP 05/30/2015 Physical Exam  Constitutional: She appears well-developed and well-nourished. No distress.  HENT:  Head: Normocephalic and atraumatic.  Right Ear: External ear normal.  Left Ear: External ear normal.  Eyes: Conjunctivae are normal. Right eye exhibits no discharge. Left eye exhibits no discharge. No scleral icterus.  Neck: Neck supple. No tracheal deviation present.  Cardiovascular: Normal rate.   Pulmonary/Chest: Effort normal. No stridor. No respiratory distress.  Musculoskeletal: She exhibits no edema.  Left forearm volar aspect 4x2 cm laceration through the dermis No vascular nerve or tendon involvement No active bleeding  Neurological: She is alert. Cranial nerve deficit: no gross deficits.  Skin: Skin is warm and dry. No rash noted.  Psychiatric: She has a normal mood and affect.  Nursing note and vitals reviewed.   ED Course  Procedures (including critical care time)  DIAGNOSTIC STUDIES: Oxygen Saturation is 100% on RA, normal by my interpretation.    COORDINATION OF CARE:  3:34 AM Will perform laceration repair. Discussed treatment plan with pt at bedside and pt  agreed to plan.     MDM   Final diagnoses:  Laceration of upper extremity, left, initial encounter   Laceration repaired by PA [place].   Tolerated well.  Suture removal in 10 days.  I personally performed the services described in this documentation, which was scribed in my presence.  The recorded information has been reviewed and is accurate.      Dorie Rank, MD 06/19/15 [phone]

## 2015-06-19 NOTE — Discharge Instructions (Signed)
Laceration Care, Adult  A laceration is a cut that goes through all layers of the skin. The cut also goes into the tissue that is right under the skin. Some cuts heal on their own. Others need to be closed with stitches (sutures), staples, skin adhesive strips, or wound glue. Taking care of your cut lowers your risk of infection and helps your cut to heal better.  HOW TO TAKE CARE OF YOUR CUT  For stitches or staples:  · Keep the wound clean and dry.  · If you were given a bandage (dressing), you should change it at least one time per day or as told by your doctor. You should also change it if it gets wet or dirty.  · Keep the wound completely dry for the first 24 hours or as told by your doctor. After that time, you may take a shower or a bath. However, make sure that the wound is not soaked in water until after the stitches or staples have been removed.  · Clean the wound one time each day or as told by your doctor:    Wash the wound with soap and water.    Rinse the wound with water until all of the soap comes off.    Pat the wound dry with a clean towel. Do not rub the wound.  · After you clean the wound, put a thin layer of antibiotic ointment on it as told by your doctor. This ointment:    Helps to prevent infection.    Keeps the bandage from sticking to the wound.  · Have your stitches or staples removed as told by your doctor.  If your doctor used skin adhesive strips:   · Keep the wound clean and dry.  · If you were given a bandage, you should change it at least one time per day or as told by your doctor. You should also change it if it gets dirty or wet.  · Do not get the skin adhesive strips wet. You can take a shower or a bath, but be careful to keep the wound dry.  · If the wound gets wet, pat it dry with a clean towel. Do not rub the wound.  · Skin adhesive strips fall off on their own. You can trim the strips as the wound heals. Do not remove any strips that are still stuck to the wound. They will  fall off after a while.  If your doctor used wound glue:  · Try to keep your wound dry, but you may briefly wet it in the shower or bath. Do not soak the wound in water, such as by swimming.  · After you take a shower or a bath, gently pat the wound dry with a clean towel. Do not rub the wound.  · Do not do any activities that will make you really sweaty until the skin glue has fallen off on its own.  · Do not apply liquid, cream, or ointment medicine to your wound while the skin glue is still on.  · If you were given a bandage, you should change it at least one time per day or as told by your doctor. You should also change it if it gets dirty or wet.  · If a bandage is placed over the wound, do not let the tape for the bandage touch the skin glue.  · Do not pick at the glue. The skin glue usually stays on for 5-10 days. Then, it   falls off of the skin.  General Instructions   · To help prevent scarring, make sure to cover your wound with sunscreen whenever you are outside after stitches are removed, after adhesive strips are removed, or when wound glue stays in place and the wound is healed. Make sure to wear a sunscreen of at least 30 SPF.  · Take over-the-counter and prescription medicines only as told by your doctor.  · If you were given antibiotic medicine or ointment, take or apply it as told by your doctor. Do not stop using the antibiotic even if your wound is getting better.  · Do not scratch or pick at the wound.  · Keep all follow-up visits as told by your doctor. This is important.  · Check your wound every day for signs of infection. Watch for:    Redness, swelling, or pain.    Fluid, blood, or pus.  · Raise (elevate) the injured area above the level of your heart while you are sitting or lying down, if possible.  GET HELP IF:  · You got a tetanus shot and you have any of these problems at the injection site:    Swelling.    Very bad pain.    Redness.    Bleeding.  · You have a fever.  · A wound that was  closed breaks open.  · You notice a bad smell coming from your wound or your bandage.  · You notice something coming out of the wound, such as wood or glass.  · Medicine does not help your pain.  · You have more redness, swelling, or pain at the site of your wound.  · You have fluid, blood, or pus coming from your wound.  · You notice a change in the color of your skin near your wound.  · You need to change the bandage often because fluid, blood, or pus is coming from the wound.  · You start to have a new rash.  · You start to have numbness around the wound.  GET HELP RIGHT AWAY IF:  · You have very bad swelling around the wound.  · Your pain suddenly gets worse and is very bad.  · You notice painful lumps near the wound or on skin that is anywhere on your body.  · You have a red streak going away from your wound.  · The wound is on your hand or foot and you cannot move a finger or toe like you usually can.  · The wound is on your hand or foot and you notice that your fingers or toes look pale or bluish.     This information is not intended to replace advice given to you by your health care provider. Make sure you discuss any questions you have with your health care provider.     Document Released: 10/24/2007 Document Revised: 09/21/2014 Document Reviewed: 05/03/2014  Elsevier Interactive Patient Education ©2016 [employer]

## 2015-07-01 ENCOUNTER — Encounter (HOSPITAL_COMMUNITY): Payer: Self-pay | Admitting: Emergency Medicine

## 2015-07-01 ENCOUNTER — Emergency Department (HOSPITAL_COMMUNITY)
Admission: EM | Admit: 2015-07-01 | Discharge: 2015-07-01 | Disposition: A | Discharge status: Home / Self Care | Payer: BLUE CROSS/BLUE SHIELD | Attending: Emergency Medicine | Admitting: Emergency Medicine

## 2015-07-01 DIAGNOSIS — Z87891 Personal history of nicotine dependence: Secondary | ICD-10-CM | POA: Insufficient documentation

## 2015-07-01 DIAGNOSIS — Z4802 Encounter for removal of sutures: Secondary | ICD-10-CM

## 2015-07-01 DIAGNOSIS — Z862 Personal history of diseases of the blood and blood-forming organs and certain disorders involving the immune mechanism: Secondary | ICD-10-CM | POA: Insufficient documentation

## 2015-07-01 DIAGNOSIS — Z8742 Personal history of other diseases of the female genital tract: Secondary | ICD-10-CM | POA: Insufficient documentation

## 2015-07-01 NOTE — ED Provider Notes (Signed)
CSN: [id number]     Arrival date & time 07/01/15  0244 History   First MD Initiated Contact with Patient 07/01/15 0257     Chief Complaint  Patient presents with  . Suture / Staple Removal     (Consider location/radiation/quality/duration/timing/severity/associated sxs/prior Treatment) Patient is a 35 y.o. female presenting with suture removal. The history is provided by the patient. No language interpreter was used.  Suture / Staple Removal Pertinent negatives include no fever or nausea. Associated symptoms comments: Patient presents for staple removal from repaired laceration on 06/19/15. No complaint of drainage, pain or post-procedure problem. .    Past Medical History  Diagnosis Date  . Dysmenorrhea   . 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 Iron deficiency anemia   . Thrombocytosis ([place])    History reviewed. No pertinent past surgical history. History reviewed. No pertinent family history. Social History  Substance Use Topics  . Smoking status: Former Smoker    Quit date: 03/05/2012  . Smokeless tobacco: Never Used  . Alcohol Use: Yes     Comment: occassionally   OB History    No data available     Review of Systems  Constitutional: Negative for fever.  Gastrointestinal: Negative for nausea.  Skin: Positive for wound.       See HPI.      Allergies  Review of patient's allergies indicates no known allergies.  Home Medications   Prior to Admission medications   Not on File   BP 124/86 mmHg  Pulse 82  Temp(Src) 98.5 F (36.9 C) (Oral)  Resp 20  SpO2 100%  LMP 06/26/2015 (Exact Date) Physical Exam  Constitutional: She appears well-developed and well-nourished. No distress.  Skin:  Well healed laceration to right volar forearm with 5 intact staples. No redness, drainage. Nontender.     ED Course  Procedures (including critical care time) Labs Review Labs Reviewed - No data to display  Imaging Review No results found. I have personally reviewed and  evaluated these images and lab results as part of my medical decision-making.   EKG Interpretation None     PROCEDURE:  Staples removed without complication.  MDM   Final diagnoses:  Removal of staple    Uncomplicated staple removal.     Charlann Lange, PA-C 07/01/15 0320  April Palumbo, MD 07/01/15 [phone]

## 2015-07-01 NOTE — Discharge Instructions (Signed)

## 2015-07-01 NOTE — ED Notes (Signed)
Pt states she had staples put in her arm on Jan 28th and was told to come have them removed in 10 days

## 2015-08-26 ENCOUNTER — Other Ambulatory Visit: Payer: BLUE CROSS/BLUE SHIELD

## 2015-08-26 ENCOUNTER — Encounter: Payer: Self-pay | Admitting: Hematology

## 2015-08-26 ENCOUNTER — Encounter: Payer: BLUE CROSS/BLUE SHIELD | Admitting: Hematology

## 2015-08-26 NOTE — Progress Notes (Signed)
No show  This encounter was created in error - please disregard.

## 2015-08-29 ENCOUNTER — Telehealth: Payer: Self-pay | Admitting: Hematology

## 2015-08-29 NOTE — Telephone Encounter (Signed)
left msg, advised pt to call back and resched per missed appt

## 2015-09-13 ENCOUNTER — Telehealth: Payer: Self-pay | Admitting: *Deleted

## 2015-09-13 ENCOUNTER — Other Ambulatory Visit (HOSPITAL_BASED_OUTPATIENT_CLINIC_OR_DEPARTMENT_OTHER): Payer: BLUE CROSS/BLUE SHIELD

## 2015-09-13 ENCOUNTER — Telehealth: Payer: Self-pay | Admitting: Hematology

## 2015-09-13 ENCOUNTER — Other Ambulatory Visit: Payer: Self-pay | Admitting: Hematology

## 2015-09-13 DIAGNOSIS — D509 Iron deficiency anemia, unspecified: Secondary | ICD-10-CM

## 2015-09-13 LAB — CBC & DIFF AND RETIC
BASO%: 0.4 % (ref 0.0–2.0)
Basophils Absolute: 0 10*3/uL (ref 0.0–0.1)
EOS ABS: 0.3 10*3/uL (ref 0.0–0.5)
EOS%: 3.7 % (ref 0.0–7.0)
HCT: 31.5 % — ABNORMAL LOW (ref 34.8–46.6)
HEMOGLOBIN: 10.6 g/dL — AB (ref 11.6–15.9)
Immature Retic Fract: 6.5 % (ref 1.60–10.00)
LYMPH#: 2.1 10*3/uL (ref 0.9–3.3)
LYMPH%: 28.2 % (ref 14.0–49.7)
MCH: 29.4 pg (ref 25.1–34.0)
MCHC: 33.7 g/dL (ref 31.5–36.0)
MCV: 87.5 fL (ref 79.5–101.0)
MONO#: 0.5 10*3/uL (ref 0.1–0.9)
MONO%: 6.2 % (ref 0.0–14.0)
NEUT%: 61.5 % (ref 38.4–76.8)
NEUTROS ABS: 4.7 10*3/uL (ref 1.5–6.5)
Platelets: 260 10*3/uL (ref 145–400)
RBC: 3.6 10*6/uL — ABNORMAL LOW (ref 3.70–5.45)
RDW: 13.7 % (ref 11.2–14.5)
RETIC %: 1.41 % (ref 0.70–2.10)
Retic Ct Abs: 50.76 10*3/uL (ref 33.70–90.70)
WBC: 7.6 10*3/uL (ref 3.9–10.3)

## 2015-09-13 NOTE — Telephone Encounter (Signed)
per pof to sch pt appt-cld & spoke to pt and adv of lab/MD/fera

## 2015-09-13 NOTE — Telephone Encounter (Signed)
Per staff message and POF I have scheduled appts. Advised scheduler of appts. JMW  

## 2015-09-14 LAB — IRON AND TIBC
%SAT: 5 % — AB (ref 21–57)
Iron: 19 ug/dL — ABNORMAL LOW (ref 41–142)
TIBC: 343 ug/dL (ref 236–444)
UIBC: 324 ug/dL (ref 120–384)

## 2015-09-14 LAB — FERRITIN: Ferritin: 5 ng/ml — ABNORMAL LOW (ref 9–269)

## 2015-09-16 ENCOUNTER — Ambulatory Visit (HOSPITAL_BASED_OUTPATIENT_CLINIC_OR_DEPARTMENT_OTHER): Payer: Self-pay

## 2015-09-16 ENCOUNTER — Telehealth: Payer: Self-pay | Admitting: *Deleted

## 2015-09-16 ENCOUNTER — Ambulatory Visit (HOSPITAL_BASED_OUTPATIENT_CLINIC_OR_DEPARTMENT_OTHER): Payer: Self-pay | Admitting: Hematology

## 2015-09-16 ENCOUNTER — Encounter: Payer: Self-pay | Admitting: Hematology

## 2015-09-16 ENCOUNTER — Telehealth: Payer: Self-pay | Admitting: Hematology

## 2015-09-16 VITALS — BP 119/83 | HR 57 | Temp 98.4°F | Resp 18 | Ht 62.0 in | Wt 119.6 lb

## 2015-09-16 VITALS — BP 111/76 | HR 66 | Temp 98.1°F | Resp 18

## 2015-09-16 DIAGNOSIS — N946 Dysmenorrhea, unspecified: Secondary | ICD-10-CM

## 2015-09-16 DIAGNOSIS — R5383 Other fatigue: Secondary | ICD-10-CM

## 2015-09-16 DIAGNOSIS — D509 Iron deficiency anemia, unspecified: Secondary | ICD-10-CM

## 2015-09-16 MED ORDER — SODIUM CHLORIDE 0.9 % IV SOLN
510.0000 mg | Freq: Once | INTRAVENOUS | Status: AC
  Administered 2015-09-16: 510 mg via INTRAVENOUS
  Filled 2015-09-16: qty 17

## 2015-09-16 NOTE — Telephone Encounter (Signed)
Per staff message and POF I have scheduled appts. Advised scheduler of appts. JMW  

## 2015-09-16 NOTE — Patient Instructions (Signed)

## 2015-09-16 NOTE — Progress Notes (Signed)
[facility] OFF[facility] PROGRESS NOTE  Adam Phenix, MD [address] [place] [address]  DIAGNOSIS: Iron deficiency anemia   CURRENT THERAPY: Feraheme as need, last in 07/2015   INTERVAL HISTORY: Helen Ford 35 y.o. female with a history of iron-deficiency anemia secondary to dysmenorrhea is here for follow-up.  She complains of moderate fatigue, which has been getting worse daily. She still has heavy menstrual period, no other signs of bleeding. She denies any chest pain or other new symptoms.  MEDICAL HISTORY: Past Medical History  Diagnosis Date  . Dysmenorrhea   . 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 Iron deficiency anemia   . Thrombocytosis ([facility])    INTERIM HISTORY: has Iron deficiency anemia; Thrombocytosis ([place]); Dysmenorrhea; Chest pain; and UTI (urinary tract infection) on her problem list.    ALLERGIES:  has No Known Allergies.  MEDICATIONS: currently has no medications in their medication list.  SURGICAL HISTORY: History reviewed. No pertinent past surgical history.  REVIEW OF SYSTEMS:   Constitutional: Denies fevers, chills or abnormal weight loss Eyes: Denies blurriness of vision Ears, nose, mouth, throat, and face: Denies mucositis or sore throat Respiratory: Denies cough, dyspnea or wheezes Cardiovascular: Denies palpitation, chest discomfort or lower extremity swelling Gastrointestinal:  Denies nausea, heartburn or change in bowel habits Skin: Denies abnormal skin rashes Lymphatics: Denies new lymphadenopathy or easy bruising Neurological:Denies numbness, tingling or new weaknesses Behavioral/Psych: Mood is stable, no new changes  All other systems were reviewed with the patient and are negative.  PHYSICAL EXAMINATION: ECOG PERFORMANCE STATUS: 1  Blood pressure 119/83, pulse 57, temperature 98.4 F (36.9 C), temperature source Oral, resp. rate 18, height 5\' 2"  (1.575 m), weight 119 lb 9.6 oz (54.25 kg), SpO2 100  %.  GENERAL:alert, no distress and comfortable; well developed, well noursished.  SKIN: skin color, texture, turgor are normal, no rashes or significant lesions EYES: normal, Conjunctiva are pink and non-injected, sclera clear OROPHARYNX:no exudate, no erythema and lips, buccal mucosa, and tongue normal  NECK: supple, thyroid normal size, non-tender, without nodularity LYMPH:  no palpable lymphadenopathy in the cervical, axillary or supraclavicular LUNGS: clear to auscultation and percussion with normal breathing effort HEART: regular rate & rhythm and no murmurs and no lower extremity edema ABDOMEN:abdomen soft, non-tender and normal bowel sounds Musculoskeletal:no cyanosis of digits and no clubbing  NEURO: alert & oriented x 3 with fluent speech, no focal motor/sensory deficits  LABORATORY DATA: CBC Latest Ref Rng 09/13/2015 03/11/2015 12/13/2014  WBC 3.9 - 10.3 10e3/uL 7.6 10.7(H) 7.5  Hemoglobin 11.6 - 15.9 g/dL 10.6(L) 13.5 13.1  Hematocrit 34.8 - 46.6 % 31.5(L) 38.6 37.9  Platelets 145 - 400 10e3/uL 260 284 282   CMP Latest Ref Rng 12/13/2014 12/13/2014 08/13/2014  Glucose 65 - 99 mg/dL 87 85 79  BUN 6 - 20 mg/dL 8 7.7 10.2  Creatinine 0.44 - 1.00 mg/dL 0.71 0.8 0.8  Sodium 135 - 145 mmol/L 136 139 138  Potassium 3.5 - 5.1 mmol/L 4.1 4.2 4.1  Chloride 101 - 111 mmol/L 104 - -  CO2 22 - 32 mmol/L 28 28 22   Calcium 8.9 - 10.3 mg/dL 9.0 9.2 9.3  Total Protein 6.4 - 8.3 g/dL 7.3 - 7.6  Total Bilirubin 0.20 - 1.20 mg/dL 0.33 - 0.44  Alkaline Phos 40 - 150 U/L 55 - 55  AST 5 - 34 U/L 20 - 16  ALT 0 - 55 U/L <6 - 6   Results for Biskup, Delmi Y (  MRN [id number]) as of 09/16/2015 08:23  Ref. Range 12/13/2014 12:30 03/11/2015 15:01 09/13/2015 15:56  Iron Latest Ref Range: 41-142 ug/dL 85  19 (L)  UIBC Latest Ref Range: 120-384 ug/dL 156  324  TIBC Latest Ref Range: 236-444 ug/dL 240  343  %SAT Latest Ref Range: 21-57 % 35  5 (L)  Ferritin Latest Ref Range: 9-269 ng/ml 112 24 5 (L)    RADIOGRAPHIC STUDIES: No results found.  ASSESSMENT: DOUGLASS SILVIUS 35 y.o. female with a history of Iron deficiency anemia  PLAN:   1. Iron deficiency anemia secondary #2.  -She Is symptomatic from her anemia and iron deficiency -she has required more frequent IV iron, we'll check her CBC and I'll level monthly -Consider IV Feraheme if her ferritin drops below 50 -She does not tolerate by mouth iron.   2. Menorrhagia. --She will follow-up with her gyenecology as needed.   OCPs did not work in the past, and she is to try to get pregnancy.  Plan -Repeat lab CBC and iron study monthly -IV Feraheme today, next week, and as needed if Feraheme less than 50 -I'll see him back in 2 month with lab one week before    All questions were answered. The patient knows to call the clinic with any problems, questions or concerns. We can certainly see the patient much sooner if necessary.  I spent 10 minutes counseling the patient face to face. The total time spent in the appointment was 15 minutes.  Truitt Merle, MD 09/16/2015 2:44 PM

## 2015-09-16 NOTE — Telephone Encounter (Signed)
per pof to sch pt appt-sent to MW to sch fera-pt to get updted copy b4 leaving trmt

## 2015-09-19 ENCOUNTER — Telehealth: Payer: Self-pay | Admitting: *Deleted

## 2015-09-19 NOTE — Telephone Encounter (Signed)
Patient called and left message to reschedule her appt. I have moved appt from 5/5 to 5/8. Patient aware of new date/time.  JMW

## 2015-09-23 ENCOUNTER — Ambulatory Visit: Payer: Self-pay

## 2015-09-26 ENCOUNTER — Ambulatory Visit (HOSPITAL_BASED_OUTPATIENT_CLINIC_OR_DEPARTMENT_OTHER): Payer: Self-pay

## 2015-09-26 VITALS — BP 114/74 | HR 73 | Temp 99.2°F | Resp 16

## 2015-09-26 DIAGNOSIS — N946 Dysmenorrhea, unspecified: Secondary | ICD-10-CM

## 2015-09-26 DIAGNOSIS — D509 Iron deficiency anemia, unspecified: Secondary | ICD-10-CM

## 2015-09-26 MED ORDER — FERUMOXYTOL INJECTION 510 MG/17 ML
510.0000 mg | Freq: Once | INTRAVENOUS | Status: AC
  Administered 2015-09-26: 510 mg via INTRAVENOUS
  Filled 2015-09-26: qty 17

## 2015-09-26 NOTE — Patient Instructions (Signed)

## 2015-09-29 ENCOUNTER — Encounter: Payer: Self-pay | Admitting: Hematology

## 2015-09-29 NOTE — Progress Notes (Signed)
S/w patient to advise of poss asst with feraheme. She advised of income- 12,000 and 1 in household. Left for dr. Burr Medico to sign and signed for patient via her ok.

## 2015-10-05 ENCOUNTER — Telehealth: Payer: Self-pay | Admitting: Hematology

## 2015-10-05 NOTE — Telephone Encounter (Signed)
pt cld & left a voicemail to r/s lab on 5/26-cld pt back left message to call us back-pt left nmo details as to when to r/s appt

## 2015-10-12 ENCOUNTER — Encounter: Payer: Self-pay | Admitting: Hematology

## 2015-10-12 NOTE — Progress Notes (Signed)
possible asst for feraheme- amag asst-faxed app to&nbsp; [phone]-approved 10/10/15-10/08/16 0 copay -sent to medical recrds and left form for dr. Burr Medico to sign-addl info

## 2015-10-14 ENCOUNTER — Other Ambulatory Visit: Payer: Self-pay

## 2015-11-11 ENCOUNTER — Other Ambulatory Visit: Payer: Self-pay

## 2015-11-18 ENCOUNTER — Ambulatory Visit: Payer: Self-pay

## 2015-11-18 ENCOUNTER — Encounter: Payer: Self-pay | Admitting: Hematology

## 2015-11-18 ENCOUNTER — Other Ambulatory Visit: Payer: Self-pay | Admitting: Hematology

## 2015-11-18 NOTE — Progress Notes (Signed)
No show  This encounter was created in error - please disregard.

## 2015-11-19 ENCOUNTER — Encounter (HOSPITAL_COMMUNITY): Payer: Self-pay | Admitting: Emergency Medicine

## 2015-11-19 ENCOUNTER — Emergency Department (HOSPITAL_COMMUNITY): Payer: Self-pay

## 2015-11-19 ENCOUNTER — Emergency Department (HOSPITAL_COMMUNITY)
Admission: EM | Admit: 2015-11-19 | Discharge: 2015-11-19 | Disposition: A | Discharge status: Home / Self Care | Payer: Self-pay | Attending: Emergency Medicine | Admitting: Emergency Medicine

## 2015-11-19 DIAGNOSIS — R000 Tachycardia, unspecified: Secondary | ICD-10-CM | POA: Insufficient documentation

## 2015-11-19 DIAGNOSIS — B349 Viral infection, unspecified: Secondary | ICD-10-CM | POA: Insufficient documentation

## 2015-11-19 DIAGNOSIS — R55 Syncope and collapse: Secondary | ICD-10-CM | POA: Insufficient documentation

## 2015-11-19 DIAGNOSIS — Z87891 Personal history of nicotine dependence: Secondary | ICD-10-CM | POA: Insufficient documentation

## 2015-11-19 LAB — URINE MICROSCOPIC-ADD ON: RBC / HPF: NONE SEEN RBC/hpf (ref 0–5)

## 2015-11-19 LAB — BASIC METABOLIC PANEL
ANION GAP: 6 (ref 5–15)
BUN: 6 mg/dL (ref 6–20)
CALCIUM: 9.4 mg/dL (ref 8.9–10.3)
CO2: 25 mmol/L (ref 22–32)
Chloride: 103 mmol/L (ref 101–111)
Creatinine, Ser: 0.85 mg/dL (ref 0.44–1.00)
Glucose, Bld: 87 mg/dL (ref 65–99)
POTASSIUM: 3.7 mmol/L (ref 3.5–5.1)
Sodium: 134 mmol/L — ABNORMAL LOW (ref 135–145)

## 2015-11-19 LAB — URINALYSIS, ROUTINE W REFLEX MICROSCOPIC
Bilirubin Urine: NEGATIVE
Glucose, UA: NEGATIVE mg/dL
Ketones, ur: NEGATIVE mg/dL
NITRITE: NEGATIVE
PH: 6 (ref 5.0–8.0)
Protein, ur: NEGATIVE mg/dL
SPECIFIC GRAVITY, URINE: 1.007 (ref 1.005–1.030)

## 2015-11-19 LAB — CBC WITH DIFFERENTIAL/PLATELET
BASOS ABS: 0 10*3/uL (ref 0.0–0.1)
BASOS PCT: 0 %
Eosinophils Absolute: 0 10*3/uL (ref 0.0–0.7)
Eosinophils Relative: 0 %
HEMATOCRIT: 41.8 % (ref 36.0–46.0)
HEMOGLOBIN: 14.7 g/dL (ref 12.0–15.0)
LYMPHS PCT: 4 %
Lymphs Abs: 0.5 10*3/uL — ABNORMAL LOW (ref 0.7–4.0)
MCH: 31.5 pg (ref 26.0–34.0)
MCHC: 35.2 g/dL (ref 30.0–36.0)
MCV: 89.7 fL (ref 78.0–100.0)
Monocytes Absolute: 1.1 10*3/uL — ABNORMAL HIGH (ref 0.1–1.0)
Monocytes Relative: 8 %
NEUTROS ABS: 12.2 10*3/uL — AB (ref 1.7–7.7)
NEUTROS PCT: 88 %
Platelets: 204 10*3/uL (ref 150–400)
RBC: 4.66 MIL/uL (ref 3.87–5.11)
RDW: 12.9 % (ref 11.5–15.5)
WBC: 13.8 10*3/uL — ABNORMAL HIGH (ref 4.0–10.5)

## 2015-11-19 LAB — I-STAT CG4 LACTIC ACID, ED: Lactic Acid, Venous: 1.1 mmol/L (ref 0.5–1.9)

## 2015-11-19 LAB — PREGNANCY, URINE: Preg Test, Ur: NEGATIVE

## 2015-11-19 LAB — RAPID URINE DRUG SCREEN, HOSP PERFORMED
AMPHETAMINES: NOT DETECTED
BENZODIAZEPINES: NOT DETECTED
Barbiturates: NOT DETECTED
COCAINE: NOT DETECTED
OPIATES: NOT DETECTED
TETRAHYDROCANNABINOL: NOT DETECTED

## 2015-11-19 LAB — CBG MONITORING, ED: Glucose-Capillary: 80 mg/dL (ref 65–99)

## 2015-11-19 LAB — D-DIMER, QUANTITATIVE: D-Dimer, Quant: <0.27 ug/mL-FEU (ref 0.00–0.50)

## 2015-11-19 MED ORDER — KETOROLAC TROMETHAMINE 30 MG/ML IJ SOLN
30.0000 mg | Freq: Once | INTRAMUSCULAR | Status: AC
  Administered 2015-11-19: 30 mg via INTRAVENOUS
  Filled 2015-11-19: qty 1

## 2015-11-19 MED ORDER — ACETAMINOPHEN 325 MG PO TABS
650.0000 mg | ORAL_TABLET | Freq: Once | ORAL | Status: AC
  Administered 2015-11-19: 650 mg via ORAL
  Filled 2015-11-19: qty 2

## 2015-11-19 MED ORDER — SODIUM CHLORIDE 0.9 % IV BOLUS (SEPSIS)
1000.0000 mL | Freq: Once | INTRAVENOUS | Status: AC
  Administered 2015-11-19: 1000 mL via INTRAVENOUS

## 2015-11-19 NOTE — ED Notes (Signed)
Pt uncooperative with assessment

## 2015-11-19 NOTE — ED Provider Notes (Signed)
CSN: [id number]     Arrival date & time 11/19/15  1339 History   First MD Initiated Contact with Patient 11/19/15 1356     Chief Complaint  Patient presents with  . Loss of Consciousness     (Consider location/radiation/quality/duration/timing/severity/associated sxs/prior Treatment) HPI Helen Ford is a 35 y.o. female with a history of iron deficiency anemia, here for evaluation of syncopal event. Mom is at bedside who reports patient called her roughly 1 hour ago and stated she felt like she may have the flu. Patient brought in via EMS he reports patient had syncopal episode while walking to her car at work just prior to arrival. Coworkers prevented patient from falling. Patient reports a frontal headache now, body aches. Family denies any recent travel, environmental exposures including insects/tick bites. Patient not fully cooperative with history of present illness.  Past Medical History  Diagnosis Date  . Dysmenorrhea   . 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 Iron deficiency anemia   . Thrombocytosis ([place])    History reviewed. No pertinent past surgical history. No family history on file. Social History  Substance Use Topics  . Smoking status: Former Smoker    Quit date: 03/05/2012  . Smokeless tobacco: Never Used  . Alcohol Use: Yes     Comment: occassionally   OB History    No data available     Review of Systems A 10 point review of systems was completed and was negative except for pertinent positives and negatives as mentioned in the history of present illness     Allergies  Review of patient's allergies indicates no known allergies.  Home Medications   Prior to Admission medications   Not on File   BP 100/65 mmHg  Pulse 98  Temp(Src) 99.1 F (37.3 C) (Oral)  Resp 16  Ht 5\' 5"  (1.651 m)  Wt 52.164 kg  BMI 19.14 kg/m2  SpO2 100%  LMP 11/05/2015 Physical Exam  Constitutional: She is oriented to person, place, and time. She appears well-developed and  well-nourished.  Patient seems tired and does not fully cooperate with exam.  HENT:  Head: Normocephalic and atraumatic.  Mouth/Throat: Oropharynx is clear and moist.  Eyes: Conjunctivae are normal. Pupils are equal, round, and reactive to light. Right eye exhibits no discharge. Left eye exhibits no discharge. No scleral icterus.  Neck: Neck supple.  No meningismus or nuchal rigidity  Cardiovascular: Regular rhythm and normal heart sounds.   Mild tachycardia, low 100s  Pulmonary/Chest: Effort normal and breath sounds normal. No respiratory distress. She has no wheezes. She has no rales.  Abdominal: Soft. There is no tenderness.  Musculoskeletal: She exhibits no tenderness.  Neurological: She is alert and oriented to person, place, and time.  Cranial Nerves II-XII grossly intact  Skin: Skin is warm and dry. No rash noted.  Psychiatric: She has a normal mood and affect.  Nursing note and vitals reviewed.   ED Course  Procedures (including critical care time) Labs Review Labs Reviewed  BASIC METABOLIC PANEL - Abnormal; Notable for the following:    Sodium 134 (*)    All other components within normal limits  CBC WITH DIFFERENTIAL/PLATELET - Abnormal; Notable for the following:    WBC 13.8 (*)    Neutro Abs 12.2 (*)    Lymphs Abs 0.5 (*)    Monocytes Absolute 1.1 (*)    All other components within normal limits  URINALYSIS, ROUTINE W REFLEX MICROSCOPIC (NOT AT [facility]) - Abnormal; Notable for the following:  APPearance CLOUDY (*)    Hgb urine dipstick TRACE (*)    Leukocytes, UA LARGE (*)    All other components within normal limits  URINE MICROSCOPIC-ADD ON - Abnormal; Notable for the following:    Squamous Epithelial / LPF 6-30 (*)    Bacteria, UA MANY (*)    All other components within normal limits  CULTURE, BLOOD (ROUTINE X 2)  CULTURE, BLOOD (ROUTINE X 2)  PREGNANCY, URINE  URINE RAPID DRUG SCREEN, HOSP PERFORMED  D-DIMER, QUANTITATIVE (NOT AT [facility])  I-STAT CG4 LACTIC  ACID, ED  CBG MONITORING, ED    Imaging Review Dg Chest 2 View  11/19/2015  CLINICAL DATA:  Syncopal episode EXAM: CHEST  2 VIEW COMPARISON:  06/25/2008 FINDINGS: The heart size and mediastinal contours are within normal limits. Both lungs are clear. The visualized skeletal structures are unremarkable. IMPRESSION: No active cardiopulmonary disease. Electronically Signed   By: Inez Catalina M.D.   On: 11/19/2015 15:45   I have personally reviewed and evaluated these images and lab results as part of my medical decision-making.   EKG Interpretation   Date/Time:  Saturday November 19 2015 13:45:39 EDT Ventricular Rate:  112 PR Interval:    QRS Duration: 79 QT Interval:  298 QTC Calculation: 407 R Axis:   67 Text Interpretation:  Sinus tachycardia Probable left atrial enlargement  RSR' in V1 or V2, right VCD or RVH Borderline T abnormalities, anterior  leads Since previous tracing rate faster Confirmed by [facility]  MD, MARTHA  [phone]) on 11/19/2015 2:48:53 PM     15:15--Patient reports she feels much better, she is alert, oriented, talkative, GCS 15. She just states that she has diffuse myalgias. Denies cardiac pulmonary symptoms, urinary symptoms. Her last menstrual cycle 2 weeks ago and normal for her. MDM  Otherwise healthy female with a history of iron deficiency anemia presents for evaluation of syncope. On arrival, she complains of diffuse myalgias as she is febrile at 100.7. No URI or UTI complaints. Physical exam is unremarkable, no meningeal signs. No history of environmental exposures or tick bites. Screening labs are significant for a leukocytosis of 13.8. Lactic is normal. Given Tylenol and Toradol for fever. Pending screening labs. D-dimer to evaluate for possible PE. EKG with sinus tachycardia. Chest x-ray is unremarkable. Patient care signed out to oncoming provider, Dr. Kathrynn Humble for follow-up on pending labs, reevaluation and subsequent disposition. Final diagnoses:  Viral syndrome   Syncope and collapse        Comer Locket, PA-C 11/20/15 [id number]  Alfonzo Beers, MD 11/21/15 [phone]

## 2015-11-19 NOTE — Discharge Instructions (Signed)
Syncope °Syncope is a medical term for fainting or passing out. This means you lose consciousness and drop to the ground. People are generally unconscious for less than 5 minutes. You may have some muscle twitches for up to 15 seconds before waking up and returning to normal. Syncope occurs more often in older adults, but it can happen to anyone. While most causes of syncope are not dangerous, syncope can be a sign of a serious medical problem. It is important to seek medical care.  °CAUSES  °Syncope is caused by a sudden drop in blood flow to the brain. The specific cause is often not determined. Factors that can bring on syncope include: °· Taking medicines that lower blood pressure. °· Sudden changes in posture, such as standing up quickly. °· Taking more medicine than prescribed. °· Standing in one place for too long. °· Seizure disorders. °· Dehydration and excessive exposure to heat. °· Low blood sugar (hypoglycemia). °· Straining to have a bowel movement. °· Heart disease, irregular heartbeat, or other circulatory problems. °· Fear, emotional distress, seeing blood, or severe pain. °SYMPTOMS  °Right before fainting, you may: °· Feel dizzy or light-headed. °· Feel nauseous. °· See all white or all black in your field of vision. °· Have cold, clammy skin. °DIAGNOSIS  °Your health care provider will ask about your symptoms, perform a physical exam, and perform an electrocardiogram (ECG) to record the electrical activity of your heart. Your health care provider may also perform other heart or blood tests to determine the cause of your syncope which may include: °· Transthoracic echocardiogram (TTE). During echocardiography, sound waves are used to evaluate how blood flows through your heart. °· Transesophageal echocardiogram (TEE). °· Cardiac monitoring. This allows your health care provider to monitor your heart rate and rhythm in real time. °· Holter monitor. This is a portable device that records your  heartbeat and can help diagnose heart arrhythmias. It allows your health care provider to track your heart activity for several days, if needed. °· Stress tests by exercise or by giving medicine that makes the heart beat faster. °TREATMENT  °In most cases, no treatment is needed. Depending on the cause of your syncope, your health care provider may recommend changing or stopping some of your medicines. °HOME CARE INSTRUCTIONS °· Have someone stay with you until you feel stable. °· Do not drive, use machinery, or play sports until your health care provider says it is okay. °· Keep all follow-up appointments as directed by your health care provider. °· Lie down right away if you start feeling like you might faint. Breathe deeply and steadily. Wait until all the symptoms have passed. °· Drink enough fluids to keep your urine clear or pale yellow. °· If you are taking blood pressure or heart medicine, get up slowly and take several minutes to sit and then stand. This can reduce dizziness. °SEEK IMMEDIATE MEDICAL CARE IF:  °· You have a severe headache. °· You have unusual pain in the chest, abdomen, or back. °· You are bleeding from your mouth or rectum, or you have black or tarry stool. °· You have an irregular or very fast heartbeat. °· You have pain with breathing. °· You have repeated fainting or seizure-like jerking during an episode. °· You faint when sitting or lying down. °· You have confusion. °· You have trouble walking. °· You have severe weakness. °· You have vision problems. °If you fainted, call your local emergency services (911 in [place] Do not drive   yourself to the hospital.    This information is not intended to replace advice given to you by your health care provider. Make sure you discuss any questions you have with your health care provider.   Document Released: 05/07/2005 Document Revised: 09/21/2014 Document Reviewed: 07/06/2011 Elsevier Interactive Patient Education 2016 [place]  (Viral Infections)  Un virus es un tipo de germen. Puede causar:   Dolor de garganta leve.  Dolores musculares.  Dolor de [place].  Secrecin nasal.  Erupciones.  Lagrimeo.  Cansancio.  Tos.  Prdida del apetito.  Ganas de vomitar (nuseas).  Vmitos.  Materia fecal lquida (diarrea). CUIDADOS EN EL HOGAR   Tome la medicacin slo como le haya indicado el mdico.  Beba gran cantidad de lquido para mantener la orina de tono claro o color amarillo plido. Las bebidas deportivas son Pamala Hurry eleccin.  Descanse lo suficiente y [employer]. Puede tomar sopas y caldos con crackers o arroz. SOLICITE AYUDA DE INMEDIATO SI:   Siente un dolor de cabeza muy intenso.  Le falta el aire.  Tiene dolor en el pecho o en el cuello.  Tiene una erupcin que no tena antes.  No puede detener los vmitos.  Tiene una hemorragia que no se detiene.  No puede retener los lquidos.  Usted o el nio tienen una temperatura oral le sube a ms de 38,9 C (102 F), y no puede bajarla con medicamentos.  Su beb tiene ms de 3 meses y su temperatura rectal es de 102 F (38.9 C) o ms.  Su beb tiene 3 meses o menos y su temperatura rectal es de 100.4 F (38 C) o ms. ASEGRESE DE QUE:   Comprende estas instrucciones.  Controlar la enfermedad.  Solicitar ayuda de inmediato si no mejora o si empeora.   Esta informacin no tiene Marine scientist el consejo del mdico. Asegrese de hacerle al mdico cualquier pregunta que tenga.   Document Released: 10/09/2010 Document Revised: 07/30/2011 Elsevier Interactive Patient Education [employer].

## 2015-11-19 NOTE — ED Notes (Signed)
Received pt via EMS from work with c/o pt had syncopal episode while walking from car to work. Co-workers prevented pt from falling. Pt was talking to EMS on the way to hospital. Pt would not talk while her in the ED without encouragement.

## 2015-11-19 NOTE — ED Provider Notes (Signed)
  Physical Exam  BP 102/65 mmHg  Pulse 108  Temp(Src) 102 F (38.9 C) (Oral)  Resp 26  Ht 5\' 5"  (1.651 m)  Wt 115 lb (52.164 kg)  BMI 19.14 kg/m2  SpO2 100%  LMP 11/05/2015  Physical Exam  ED Course  Procedures  MDM  Pt comes in with cc of syncope. Pt had fevers at arrival. CXR is neg. WC is 13.8 - 3/4 SIRS with lactate normal. Pt has no source of infection, but she is having myalgias generalized. No uri like sx, no travel hx or tick bites. Dimer ordered - will CT if +.  Filed Vitals:   11/19/15 1500 11/19/15 1552  BP: 102/65   Pulse: 108   Temp:  102 F (38.9 C)  Resp: 26        EKG Interpretation  Date/Time:  Saturday November 19 2015 13:45:39 EDT Ventricular Rate:  112 PR Interval:    QRS Duration: 79 QT Interval:  298 QTC Calculation: 407 R Axis:   67 Text Interpretation:  Sinus tachycardia Probable left atrial enlargement RSR' in V1 or V2, right VCD or RVH Borderline T abnormalities, anterior leads Since previous tracing rate faster Confirmed by [facility]  MD, MARTHA [phone]) on 11/19/2015 2:48:53 PM        Varney Biles, MD 11/19/15 1640

## 2015-11-19 NOTE — ED Notes (Signed)
Pt out of room for testing. 

## 2015-11-20 ENCOUNTER — Telehealth: Payer: Self-pay | Admitting: Hematology

## 2015-11-20 NOTE — Telephone Encounter (Signed)
lvm for pt regarding to calling back to r/s missed appt

## 2015-11-22 ENCOUNTER — Emergency Department (HOSPITAL_COMMUNITY)
Admission: EM | Admit: 2015-11-22 | Discharge: 2015-11-22 | Disposition: A | Discharge status: Home / Self Care | Payer: Self-pay | Attending: Dermatology | Admitting: Dermatology

## 2015-11-22 DIAGNOSIS — Z5321 Procedure and treatment not carried out due to patient leaving prior to being seen by health care provider: Secondary | ICD-10-CM | POA: Insufficient documentation

## 2015-11-22 DIAGNOSIS — Z87891 Personal history of nicotine dependence: Secondary | ICD-10-CM | POA: Insufficient documentation

## 2015-11-22 NOTE — ED Notes (Signed)
Pt states that she needs an antibiotic; pt states that she wants a shot to feel bette; RN advised that if she was advised that she had a virus that it just has to run its course; RN advised that we will be more than happy to see her but that it may be the same outcome; Pt states "Then what am I here for?"; RN advised that whether or not she was seen is up to her but that treatment for a virus is symptomatic care; pt states "I don't want to stay here if it the same thing" pt walks out of triage

## 2015-11-24 LAB — CULTURE, BLOOD (ROUTINE X 2)
Culture: NO GROWTH
Culture: NO GROWTH

## 2015-12-16 ENCOUNTER — Ambulatory Visit: Payer: Self-pay | Admitting: Hematology

## 2015-12-16 NOTE — Progress Notes (Deleted)
[facility] OFFICE PROGRESS NOTE  Adam Phenix, MD [address], [place] [address]  DIAGNOSIS: No diagnosis found.   CURRENT THERAPY: Feraheme as need, last in 07/2015   INTERVAL HISTORY: Helen Ford 35 y.o. female with a history of iron-deficiency anemia secondary to dysmenorrhea is here for follow-up.  She complains of moderate fatigue, which has been getting worse daily. She still has heavy menstrual period, no other signs of bleeding. She denies any chest pain or other new symptoms.  MEDICAL HISTORY: Past Medical History:  Diagnosis Date  . Dysmenorrhea   . 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 Iron deficiency anemia   . Thrombocytosis ([facility])    INTERIM HISTORY: has Iron deficiency anemia; Thrombocytosis ([place]); Dysmenorrhea; Chest pain; and UTI (urinary tract infection) on her problem list.    ALLERGIES:  has No Known Allergies.  MEDICATIONS: currently has no medications in their medication list.  SURGICAL HISTORY: No past surgical history on file.  REVIEW OF SYSTEMS:   Constitutional: Denies fevers, chills or abnormal weight loss Eyes: Denies blurriness of vision Ears, nose, mouth, throat, and face: Denies mucositis or sore throat Respiratory: Denies cough, dyspnea or wheezes Cardiovascular: Denies palpitation, chest discomfort or lower extremity swelling Gastrointestinal:  Denies nausea, heartburn or change in bowel habits Skin: Denies abnormal skin rashes Lymphatics: Denies new lymphadenopathy or easy bruising Neurological:Denies numbness, tingling or new weaknesses Behavioral/Psych: Mood is stable, no new changes  All other systems were reviewed with the patient and are negative.  PHYSICAL EXAMINATION: ECOG PERFORMANCE STATUS: 1  Last menstrual period 11/05/2015.  GENERAL:alert, no distress and comfortable; well developed, well noursished.  SKIN: skin color, texture, turgor are normal, no rashes or significant lesions EYES:  normal, Conjunctiva are pink and non-injected, sclera clear OROPHARYNX:no exudate, no erythema and lips, buccal mucosa, and tongue normal  NECK: supple, thyroid normal size, non-tender, without nodularity LYMPH:  no palpable lymphadenopathy in the cervical, axillary or supraclavicular LUNGS: clear to auscultation and percussion with normal breathing effort HEART: regular rate & rhythm and no murmurs and no lower extremity edema ABDOMEN:abdomen soft, non-tender and normal bowel sounds Musculoskeletal:no cyanosis of digits and no clubbing  NEURO: alert & oriented x 3 with fluent speech, no focal motor/sensory deficits  LABORATORY DATA: CBC Latest Ref Rng & Units 11/19/2015 09/13/2015 03/11/2015  WBC 4.0 - 10.5 K/uL 13.8(H) 7.6 10.7(H)  Hemoglobin 12.0 - 15.0 g/dL 14.7 10.6(L) 13.5  Hematocrit 36.0 - 46.0 % 41.8 31.5(L) 38.6  Platelets 150 - 400 K/uL 204 260 284   CMP Latest Ref Rng & Units 11/19/2015 12/13/2014 12/13/2014  Glucose 65 - 99 mg/dL 87 87 85  BUN 6 - 20 mg/dL 6 8 7.7  Creatinine 0.44 - 1.00 mg/dL 0.85 0.71 0.8  Sodium 135 - 145 mmol/L 134(L) 136 139  Potassium 3.5 - 5.1 mmol/L 3.7 4.1 4.2  Chloride 101 - 111 mmol/L 103 104 -  CO2 22 - 32 mmol/L 25 28 28   Calcium 8.9 - 10.3 mg/dL 9.4 9.0 9.2  Total Protein 6.4 - 8.3 g/dL - 7.3 -  Total Bilirubin 0.20 - 1.20 mg/dL - 0.33 -  Alkaline Phos 40 - 150 U/L - 55 -  AST 5 - 34 U/L - 20 -  ALT 0 - 55 U/L - <6 -   Results for Helen Ford, Helen Ford (MRN [id number]) as of 09/16/2015 08:23  Ref. Range 12/13/2014 12:30 03/11/2015 15:01 09/13/2015 15:56  Iron Latest Ref Range: 41-142 ug/dL 85  19 (L)  UIBC Latest Ref Range: 120-384 ug/dL 156  324  TIBC Latest Ref Range: 236-444 ug/dL 240  343  %SAT Latest Ref Range: 21-57 % 35  5 (L)  Ferritin Latest Ref Range: 9-269 ng/ml 112 24 5 (L)   RADIOGRAPHIC STUDIES: No results found.  ASSESSMENT: Helen Ford 35 y.o. female with a history of No diagnosis found.  PLAN:   1. Iron deficiency anemia  secondary #2.  -She Is symptomatic from her anemia and iron deficiency -she has required more frequent IV iron, we'll check her CBC and I'll level monthly -Consider IV Feraheme if her ferritin drops below 50 -She does not tolerate by mouth iron.   2. Menorrhagia. --She will follow-up with her gyenecology as needed.   OCPs did not work in the past, and she is to try to get pregnancy.  Plan -Repeat lab CBC and iron study monthly -IV Feraheme today, next week, and as needed if Feraheme less than 50 -I'll see him back in 2 month with lab one week before    All questions were answered. The patient knows to call the clinic with any problems, questions or concerns. We can certainly see the patient much sooner if necessary.  I spent 10 minutes counseling the patient face to face. The total time spent in the appointment was 15 minutes.  Truitt Merle, MD 12/16/15 8:16 AM

## 2018-07-23 ENCOUNTER — Encounter (HOSPITAL_COMMUNITY): Payer: Self-pay | Admitting: Emergency Medicine

## 2018-07-23 ENCOUNTER — Emergency Department (HOSPITAL_COMMUNITY): Payer: Self-pay

## 2018-07-23 ENCOUNTER — Emergency Department (HOSPITAL_COMMUNITY)
Admission: EM | Admit: 2018-07-23 | Discharge: 2018-07-23 | Disposition: A | Discharge status: Home / Self Care | Payer: Self-pay | Attending: Emergency Medicine | Admitting: Emergency Medicine

## 2018-07-23 ENCOUNTER — Other Ambulatory Visit: Payer: Self-pay

## 2018-07-23 DIAGNOSIS — D649 Anemia, unspecified: Secondary | ICD-10-CM | POA: Insufficient documentation

## 2018-07-23 DIAGNOSIS — Z87891 Personal history of nicotine dependence: Secondary | ICD-10-CM | POA: Insufficient documentation

## 2018-07-23 DIAGNOSIS — R05 Cough: Secondary | ICD-10-CM

## 2018-07-23 DIAGNOSIS — R059 Cough, unspecified: Secondary | ICD-10-CM

## 2018-07-23 LAB — BASIC METABOLIC PANEL
Anion gap: 7 (ref 5–15)
BUN: 12 mg/dL (ref 6–20)
CO2: 23 mmol/L (ref 22–32)
Calcium: 9.2 mg/dL (ref 8.9–10.3)
Chloride: 109 mmol/L (ref 98–111)
Creatinine, Ser: 0.67 mg/dL (ref 0.44–1.00)
GFR calc Af Amer: >60 mL/min (ref >60)
GFR calc non Af Amer: >60 mL/min (ref >60)
Glucose, Bld: 99 mg/dL (ref 70–99)
Potassium: 3.4 mmol/L — ABNORMAL LOW (ref 3.5–5.1)
Sodium: 139 mmol/L (ref 135–145)

## 2018-07-23 LAB — CBC
HCT: 26 % — ABNORMAL LOW (ref 36.0–46.0)
Hemoglobin: 6.6 g/dL — CL (ref 12.0–15.0)
MCH: 19 pg — ABNORMAL LOW (ref 26.0–34.0)
MCHC: 25.4 g/dL — ABNORMAL LOW (ref 30.0–36.0)
MCV: 74.7 fL — ABNORMAL LOW (ref 80.0–100.0)
Platelets: 666 10*3/uL — ABNORMAL HIGH (ref 150–400)
RBC: 3.48 MIL/uL — ABNORMAL LOW (ref 3.87–5.11)
RDW: 22.8 % — ABNORMAL HIGH (ref 11.5–15.5)
WBC: 6.8 10*3/uL (ref 4.0–10.5)
nRBC: 0 % (ref 0.0–0.2)

## 2018-07-23 LAB — PREPARE RBC (CROSSMATCH)

## 2018-07-23 LAB — I-STAT TROPONIN, ED: Troponin i, poc: 0 ng/mL (ref 0.00–0.08)

## 2018-07-23 LAB — I-STAT BETA HCG BLOOD, ED (MC, WL, AP ONLY): I-stat hCG, quantitative: <5 m[IU]/mL (ref <5)

## 2018-07-23 MED ORDER — GUAIFENESIN-CODEINE 100-10 MG/5ML PO SOLN
5.0000 mL | Freq: Once | ORAL | Status: AC
  Administered 2018-07-23: 5 mL via ORAL
  Filled 2018-07-23: qty 5

## 2018-07-23 MED ORDER — BENZONATATE 100 MG PO CAPS: 100.0000 mg | ORAL_CAPSULE | Freq: Three times a day (TID) | ORAL | 0 refills | Status: AC | PRN

## 2018-07-23 MED ORDER — SODIUM CHLORIDE 0.9% FLUSH
3.0000 mL | Freq: Once | INTRAVENOUS | Status: AC
  Administered 2018-07-23: 3 mL via INTRAVENOUS

## 2018-07-23 MED ORDER — SODIUM CHLORIDE 0.9 % IV SOLN
10.0000 mL/h | Freq: Once | INTRAVENOUS | Status: AC
  Administered 2018-07-23: 10 mL/h via INTRAVENOUS

## 2018-07-23 NOTE — ED Notes (Signed)
Patient is resting comfortably. 

## 2018-07-23 NOTE — ED Notes (Signed)
Pt fnd in bed alert and oriented x 4 and is verbally responsive. Pt  C/o left sided chest pain 7/10, ache/. Pt c/o of anxiety and requesting medication for management.

## 2018-07-23 NOTE — ED Provider Notes (Signed)
[facility] DEPT Provider Note   CSN: [id number] Arrival date & time: 07/23/18  3474    History   Chief Complaint Chief Complaint  Patient presents with  . Chest Pain  . Cough    HPI Helen Ford is a 38 y.o. female.     The history is provided by the patient and medical records. No language interpreter was used.  Chest Pain  Associated symptoms: cough   Cough  Associated symptoms: chest pain      38 year old female with history of anemia presenting complaining of cough.  Patient report for the past week she has been feeling well.  Initially she was having some fever which has since resolved but she has had a persistent nonproductive cough.  She complaining of chest irritation from her cough.  She also complaining of feeling weak and states with her anemia, sickness usually "knock me out".  She does not complain of any headache, lightheadedness, runny nose, sneezing, sore throat, ear pain, shortness of breath, abdominal pain, dysuria, hematuria, vaginal bleeding, or abnormal bleeding.  She mentioned she needs to have iron transfusion for her iron deficiency anemia but have not had that done anytime recently.  She does not have insurance.  She denies any recent travel or recent sick contact.  She has been out of work due to feeling sick.  Past Medical History:  Diagnosis Date  . Dysmenorrhea   . 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 Iron deficiency anemia   . Thrombocytosis [facility])     Patient Active Problem List   Diagnosis Date Noted  . Chest pain 12/13/2014  . UTI (urinary tract infection) 12/13/2014  . Iron deficiency anemia   . Thrombocytosis ([place])   . Dysmenorrhea     History reviewed. No pertinent surgical history.   OB History   No obstetric history on file.      Home Medications    Prior to Admission medications   Not on File    Family History History reviewed. No pertinent family history.  Social History Social History    Tobacco Use  . Smoking status: Former Smoker    Last attempt to quit: 03/05/2012    Years since quitting: 6.3  . Smokeless tobacco: Never Used  Substance Use Topics  . Alcohol use: Yes    Comment: occassionally  . Drug use: No     Allergies   Patient has no known allergies.   Review of Systems Review of Systems  Respiratory: Positive for cough.   Cardiovascular: Positive for chest pain.  All other systems reviewed and are negative.    Physical Exam Updated Vital Signs BP 119/78 (BP Location: Left Arm)   Pulse 97   Temp 98.9 F (37.2 C) (Oral)   Resp 16   Ht 5\' 1"  (1.549 m)   Wt 52.2 kg   LMP 07/09/2018   SpO2 100%   BMI 21.73 kg/m   Physical Exam Vitals signs and nursing note reviewed.  Constitutional:      General: She is not in acute distress.    Appearance: She is well-developed.  HENT:     Head: Atraumatic.     Right Ear: Tympanic membrane normal.     Left Ear: Tympanic membrane normal.     Nose: Nose normal.     Mouth/Throat:     Mouth: Mucous membranes are moist.  Eyes:     Conjunctiva/sclera: Conjunctivae normal.  Neck:     Musculoskeletal: Neck supple.  Cardiovascular:     Rate and Rhythm: Normal rate and regular rhythm.  Pulmonary:     Breath sounds: Rhonchi present. No wheezing or rales.  Abdominal:     Palpations: Abdomen is soft. There is no splenomegaly.     Tenderness: There is no abdominal tenderness.  Skin:    Coloration: Skin is pale.     Findings: No rash.  Neurological:     Mental Status: She is alert and oriented to person, place, and time.  Psychiatric:        Mood and Affect: Mood normal.      ED Treatments / Results  Labs (all labs ordered are listed, but only abnormal results are displayed) Labs Reviewed  BASIC METABOLIC PANEL  CBC  I-STAT TROPONIN, ED  I-STAT BETA HCG BLOOD, ED ([facility], [facility], [facility] ONLY)    EKG EKG Interpretation  Date/Time:  Wednesday July 23 2018 06:25:52 EST Ventricular Rate:  97 PR  Interval:    QRS Duration: 80 QT Interval:  322 QTC Calculation: 409 R Axis:   56 Text Interpretation:  Sinus rhythm RSR' in V1 or V2, probably normal variant Borderline T abnormalities, inferior leads Baseline wander in lead(s) III aVL Confirmed by Molpus, John [phone]) on 07/23/2018 6:31:06 AM   Radiology No results found.  Procedures .Critical Care Performed by: Domenic Moras, PA-C Authorized by: Domenic Moras, PA-C   Critical care provider statement:    Critical care time (minutes):  45   Critical care was time spent personally by me on the following activities:  Discussions with consultants, evaluation of patient's response to treatment, examination of patient, ordering and performing treatments and interventions, ordering and review of laboratory studies, ordering and review of radiographic studies, pulse oximetry, re-evaluation of patient's condition, obtaining history from patient or surrogate and review of old charts   (including critical care time)  Medications Ordered in ED Medications  sodium chloride flush (NS) 0.9 % injection 3 mL (has no administration in time range)     Initial Impression / Assessment and Plan / ED Course  I have reviewed the triage vital signs and the nursing notes.  Pertinent labs & imaging results that were available during my care of the patient were reviewed by me and considered in my medical decision making (see chart for details).        BP (!) 105/59   Pulse 83   Temp 98 F (36.7 C) (Oral)   Resp (!) 26   Ht 5\' 1"  (1.549 m)   Wt 52.2 kg   LMP 07/09/2018   SpO2 100%   BMI 21.73 kg/m    Final Clinical Impressions(s) / ED Diagnoses   Final diagnoses:  Symptomatic anemia  Cough    ED Discharge Orders         Ordered    benzonatate (TESSALON) 100 MG capsule  3 times daily PRN     07/23/18 1256         7:26 AM Patient endorses a cough for the past several days and was concerned for pneumonia.  She feels weak and tired and  has been missing a few days of work.  On exam, she does have some rhonchi heard without any wheezes, or rales.  Chest x-ray unremarkable.  Labs remarkable for a hemoglobin of 6.6.  She does have known history of iron deficiency anemia requiring iron transfusion in the past but have not had one done in a while.  Given her complaints of sickness and weak, will  transfuse blood.  Cough medication given.  12:53 PM Patient had received 1 unit of packed red blood cells.  Vitals are stable.  She received cough medication.  Encourage patient to follow-up with Dr. for recheck of her CBG as well as continue iron transfusion as previously.  Return precautions discussed.  Tessalon Perles prescribed for cough.   Domenic Moras, PA-C 07/23/18 1258    Molpus, Jenny Reichmann, MD 07/23/18 2233

## 2018-07-23 NOTE — Discharge Instructions (Addendum)
You have been evaluated for your cough.  No evidence of pneumonia on chest xray.  Take tessalon perles as needed for your cough.  Your blood count is 6.6 today.  I have given you blood transfusion but please follow up with your doctor promptly for recheck and for iron infusion.

## 2018-07-23 NOTE — ED Notes (Signed)
Pt ambulated to x-ray.

## 2018-07-23 NOTE — ED Triage Notes (Signed)
Patient states she is having left chest pain. Patient is anemic and has cough. Patient states when she gets a cough it is hard for her to recover from it.

## 2018-07-23 NOTE — ED Notes (Signed)
Patient transported to X-ray 

## 2018-07-24 LAB — BPAM RBC
Blood Product Expiration Date: 202003302359
ISSUE DATE / TIME: 202003041024
Unit Type and Rh: 5100

## 2018-07-24 LAB — TYPE AND SCREEN
ABO/RH(D): O POS
ANTIBODY SCREEN: NEGATIVE
Unit division: 0

## 2020-06-07 IMAGING — CR DG CHEST 2V
2 series · 2 of 2 positions shown · non-contrast
Comparison: 11/19/2015

CLINICAL DATA: Cough and congestion for 1 week

EXAM:
CHEST - 2 VIEW

[w chest pa]
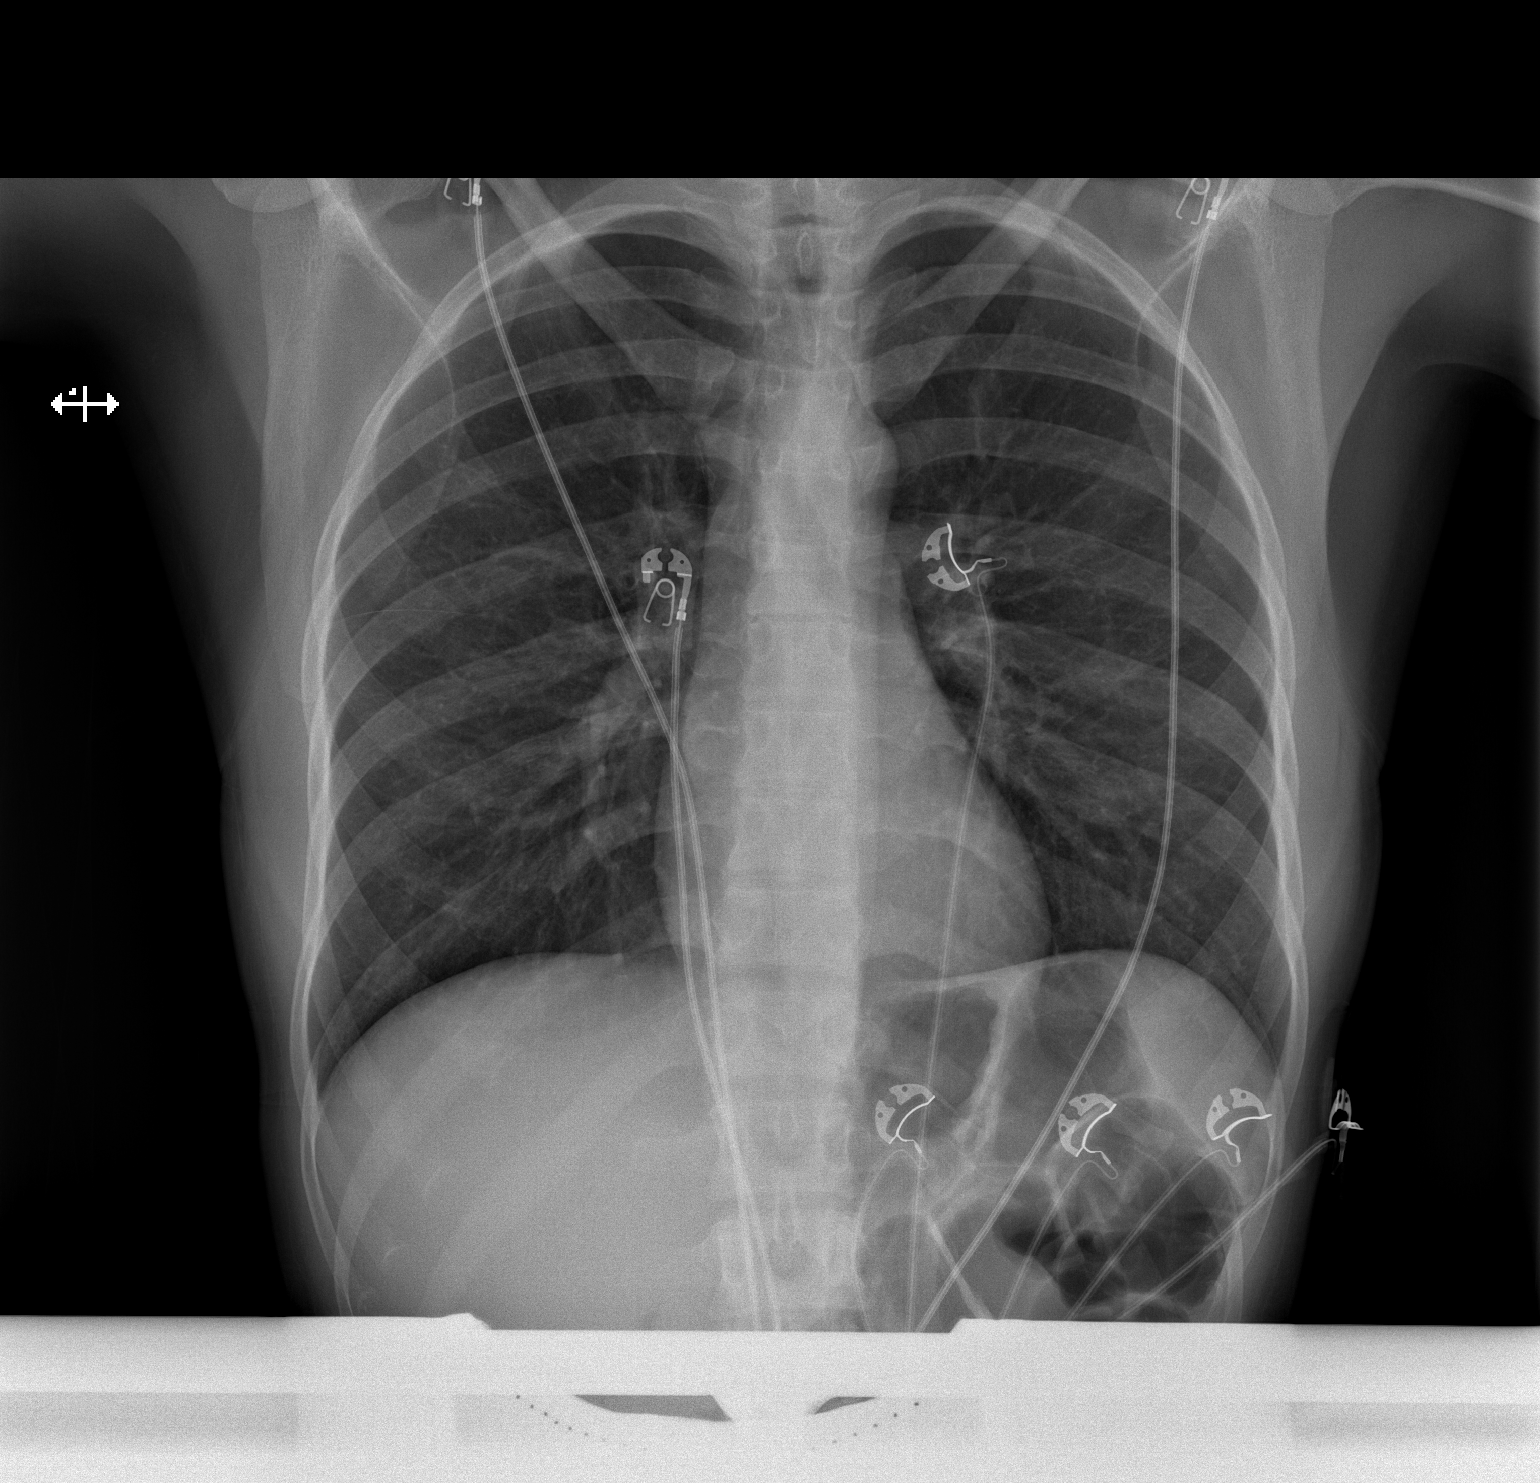

[w chest lat]
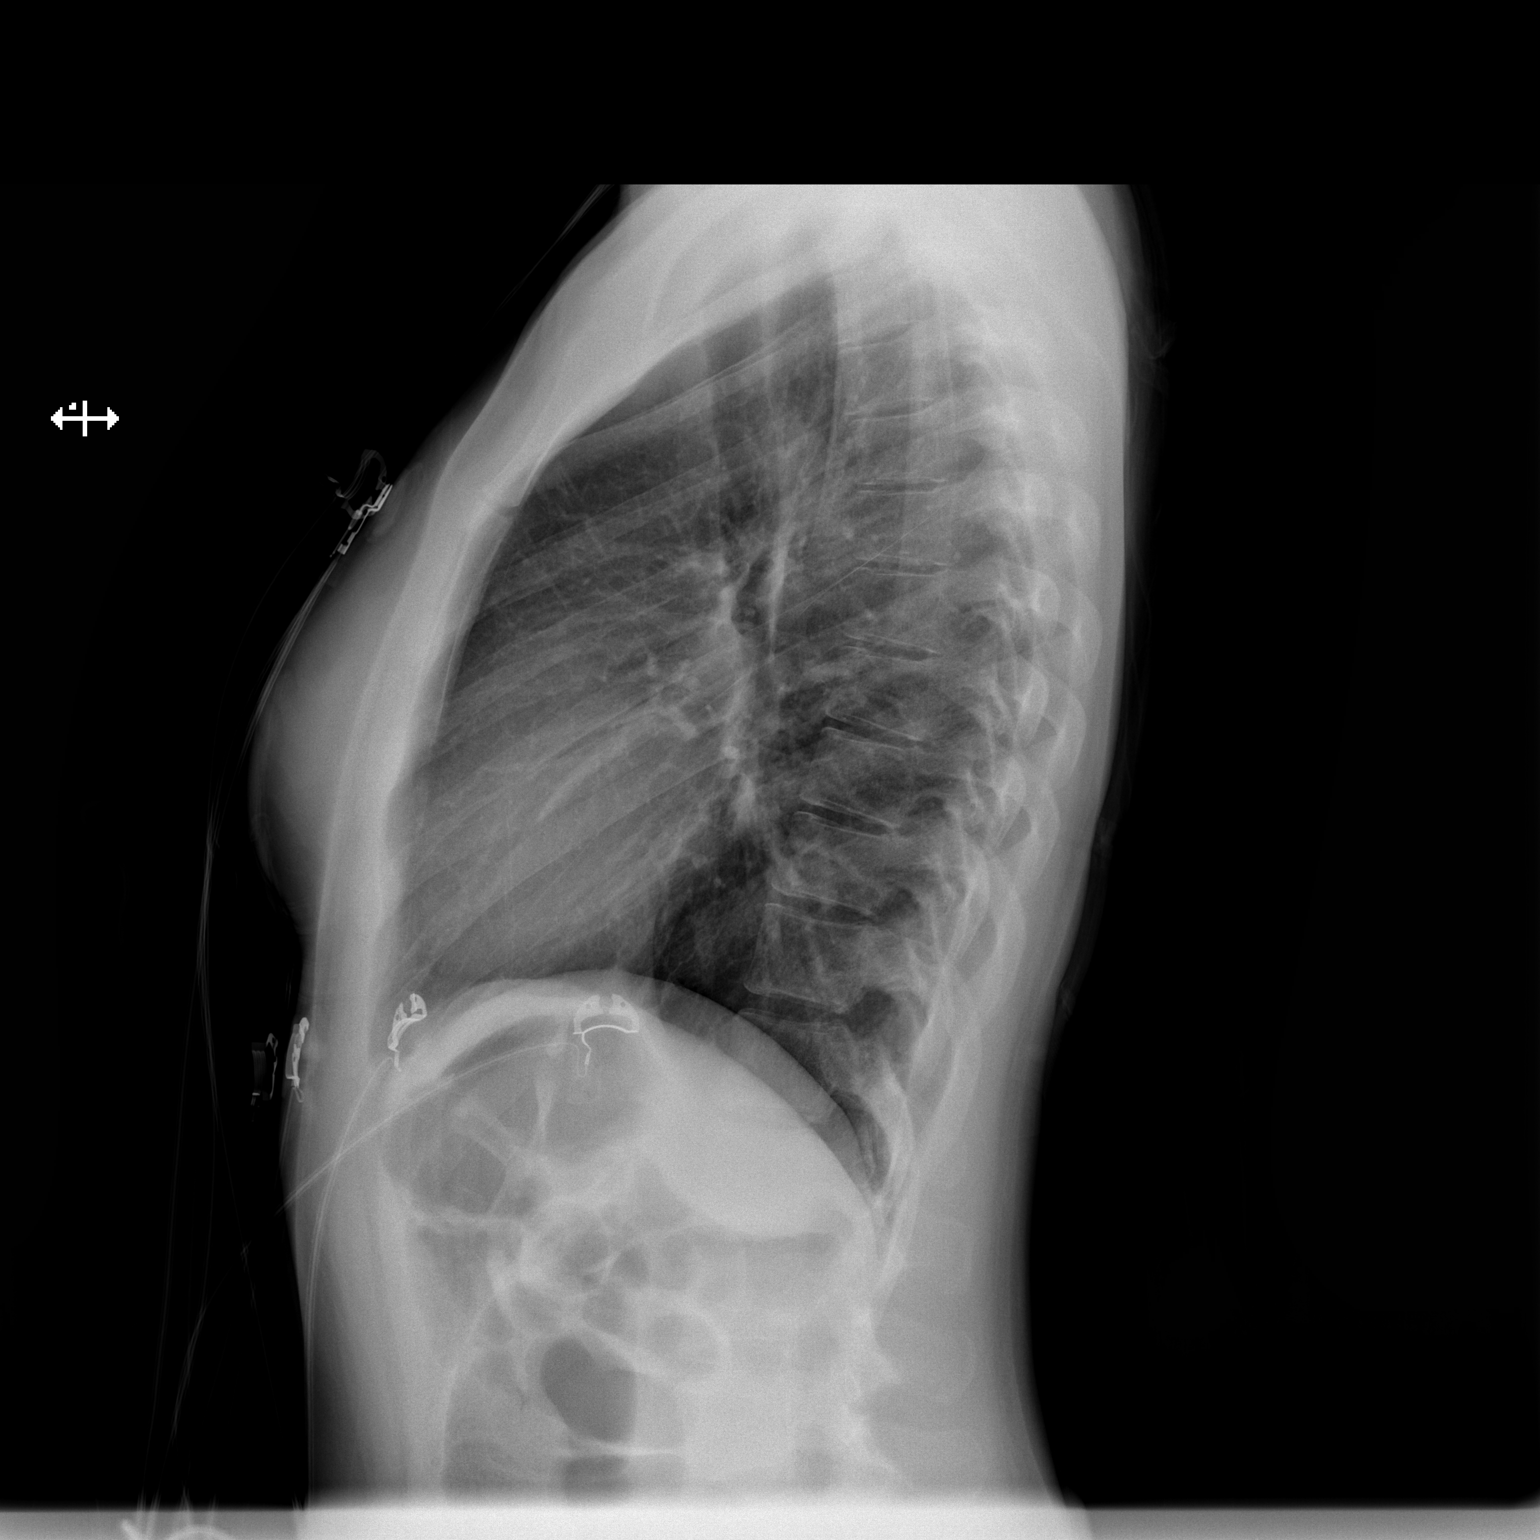

[2 of 2 positions shown; findings below may reference images not displayed]

FINDINGS: The heart size and mediastinal contours are within normal limits.
Both lungs are clear. The visualized skeletal structures are
unremarkable.
IMPRESSION: No active cardiopulmonary disease.

## 2022-03-14 DIAGNOSIS — Z1151 Encounter for screening for human papillomavirus (HPV): Secondary | ICD-10-CM | POA: Diagnosis not present

## 2022-03-14 DIAGNOSIS — Z6821 Body mass index (BMI) 21.0-21.9, adult: Secondary | ICD-10-CM | POA: Diagnosis not present

## 2022-03-14 DIAGNOSIS — Z01419 Encounter for gynecological examination (general) (routine) without abnormal findings: Secondary | ICD-10-CM | POA: Diagnosis not present

## 2022-03-14 DIAGNOSIS — N939 Abnormal uterine and vaginal bleeding, unspecified: Secondary | ICD-10-CM | POA: Diagnosis not present

## 2022-03-14 DIAGNOSIS — Z124 Encounter for screening for malignant neoplasm of cervix: Secondary | ICD-10-CM | POA: Diagnosis not present

## 2022-03-14 DIAGNOSIS — D509 Iron deficiency anemia, unspecified: Secondary | ICD-10-CM | POA: Diagnosis not present

## 2022-03-15 ENCOUNTER — Other Ambulatory Visit: Payer: Self-pay

## 2022-03-16 ENCOUNTER — Encounter: Payer: Self-pay | Admitting: Obstetrics and Gynecology

## 2022-03-16 ENCOUNTER — Encounter: Payer: Self-pay | Admitting: Hematology

## 2022-03-16 ENCOUNTER — Telehealth: Payer: Self-pay | Admitting: Pharmacy Technician

## 2022-03-16 NOTE — Telephone Encounter (Signed)
Auth Submission: NO AUTH NEEDED Payer: [employer] Medication & CPT/J Code(s) submitted: Venofer (Iron Sucrose) [id number] Route of submission (phone, fax, portal):  Phone # Fax # Auth type: Buy/Bill Units/visits requested: X3 Reference number:  Approval from: 03/16/22 to 05/20/22

## 2022-03-20 ENCOUNTER — Ambulatory Visit (INDEPENDENT_AMBULATORY_CARE_PROVIDER_SITE_OTHER): Payer: BC Managed Care – PPO

## 2022-03-20 VITALS — BP 95/63 | HR 62 | Temp 98.3°F | Resp 18 | Ht 62.0 in | Wt 113.0 lb

## 2022-03-20 DIAGNOSIS — R947 Abnormal results of other endocrine function studies: Secondary | ICD-10-CM | POA: Diagnosis not present

## 2022-03-20 DIAGNOSIS — Z1231 Encounter for screening mammogram for malignant neoplasm of breast: Secondary | ICD-10-CM | POA: Diagnosis not present

## 2022-03-20 DIAGNOSIS — D508 Other iron deficiency anemias: Secondary | ICD-10-CM | POA: Diagnosis not present

## 2022-03-20 MED ORDER — SODIUM CHLORIDE 0.9 % IV SOLN
300.0000 mg | Freq: Once | INTRAVENOUS | Status: AC
  Administered 2022-03-20: 300 mg via INTRAVENOUS
  Filled 2022-03-20: qty 15

## 2022-03-20 MED ORDER — DIPHENHYDRAMINE HCL 50 MG/ML IJ SOLN
25.0000 mg | Freq: Once | INTRAMUSCULAR | Status: AC
  Administered 2022-03-20: 25 mg via INTRAVENOUS
  Filled 2022-03-20: qty 1

## 2022-03-20 MED ORDER — ACETAMINOPHEN 325 MG PO TABS
650.0000 mg | ORAL_TABLET | Freq: Once | ORAL | Status: AC
  Administered 2022-03-20: 650 mg via ORAL
  Filled 2022-03-20: qty 2

## 2022-03-20 MED ORDER — DIPHENHYDRAMINE HCL 25 MG PO CAPS
25.0000 mg | ORAL_CAPSULE | Freq: Once | ORAL | Status: DC
  Filled 2022-03-20: qty 1

## 2022-03-20 NOTE — Progress Notes (Signed)
Diagnosis: Iron Deficiency Anemia  Provider:  Marshell Garfinkel MD  Procedure: Infusion  IV Type: Peripheral, IV Location: L Antecubital  Venofer (Iron Sucrose), Dose: 300 mg  Infusion Start Time: 9672  Infusion Stop Time: 8979  Post Infusion IV Care: Peripheral IV Discontinued and Pt declined observation.  Discharge: Condition: Good, Destination: Home . AVS Declined  Performed by:  Adelina Mings, LPN

## 2022-03-26 ENCOUNTER — Other Ambulatory Visit: Payer: Self-pay | Admitting: Obstetrics and Gynecology

## 2022-03-26 DIAGNOSIS — R928 Other abnormal and inconclusive findings on diagnostic imaging of breast: Secondary | ICD-10-CM

## 2022-03-27 ENCOUNTER — Ambulatory Visit (INDEPENDENT_AMBULATORY_CARE_PROVIDER_SITE_OTHER): Payer: BC Managed Care – PPO

## 2022-03-27 VITALS — BP 102/66 | HR 68 | Temp 98.8°F | Resp 18 | Ht 62.0 in | Wt 114.6 lb

## 2022-03-27 DIAGNOSIS — D508 Other iron deficiency anemias: Secondary | ICD-10-CM

## 2022-03-27 MED ORDER — SODIUM CHLORIDE 0.9 % IV SOLN
300.0000 mg | Freq: Once | INTRAVENOUS | Status: AC
  Administered 2022-03-27: 300 mg via INTRAVENOUS
  Filled 2022-03-27: qty 15

## 2022-03-27 MED ORDER — ACETAMINOPHEN 325 MG PO TABS: 650.0000 mg | ORAL_TABLET | Freq: Once | ORAL | Status: DC

## 2022-03-27 MED ORDER — DIPHENHYDRAMINE HCL 25 MG PO CAPS: 25.0000 mg | ORAL_CAPSULE | Freq: Once | ORAL | Status: DC

## 2022-03-27 MED ORDER — DIPHENHYDRAMINE HCL 50 MG/ML IJ SOLN
25.0000 mg | Freq: Once | INTRAMUSCULAR | Status: AC
  Administered 2022-03-27: 25 mg via INTRAVENOUS

## 2022-03-27 NOTE — Progress Notes (Signed)
Diagnosis: Iron Deficiency Anemia  Provider:  Marshell Garfinkel MD  Procedure: Infusion  IV Type: Peripheral, IV Location: L Antecubital  Venofer (Iron Sucrose), Dose: 300 mg  Infusion Start Time: 3643  Infusion Stop Time: 1620  Post Infusion IV Care: Peripheral IV Discontinued  Discharge: Condition: Good, Destination: Home . AVS provided to patient.   Performed by:  Cleophus Molt, RN

## 2022-03-28 DIAGNOSIS — E221 Hyperprolactinemia: Secondary | ICD-10-CM | POA: Diagnosis not present

## 2022-03-28 DIAGNOSIS — N939 Abnormal uterine and vaginal bleeding, unspecified: Secondary | ICD-10-CM | POA: Diagnosis not present

## 2022-03-28 DIAGNOSIS — R928 Other abnormal and inconclusive findings on diagnostic imaging of breast: Secondary | ICD-10-CM | POA: Diagnosis not present

## 2022-03-28 DIAGNOSIS — N921 Excessive and frequent menstruation with irregular cycle: Secondary | ICD-10-CM | POA: Diagnosis not present

## 2022-04-03 ENCOUNTER — Other Ambulatory Visit: Payer: Self-pay | Admitting: Obstetrics and Gynecology

## 2022-04-03 ENCOUNTER — Ambulatory Visit (INDEPENDENT_AMBULATORY_CARE_PROVIDER_SITE_OTHER): Payer: BC Managed Care – PPO

## 2022-04-03 VITALS — BP 107/56 | HR 65 | Temp 98.9°F | Resp 16 | Ht 61.0 in | Wt 114.0 lb

## 2022-04-03 DIAGNOSIS — E221 Hyperprolactinemia: Secondary | ICD-10-CM

## 2022-04-03 DIAGNOSIS — D508 Other iron deficiency anemias: Secondary | ICD-10-CM | POA: Diagnosis not present

## 2022-04-03 MED ORDER — SODIUM CHLORIDE 0.9 % IV SOLN
300.0000 mg | Freq: Once | INTRAVENOUS | Status: AC
  Administered 2022-04-03: 300 mg via INTRAVENOUS
  Filled 2022-04-03: qty 15

## 2022-04-03 MED ORDER — ACETAMINOPHEN 325 MG PO TABS: 650.0000 mg | ORAL_TABLET | Freq: Once | ORAL | Status: DC

## 2022-04-03 MED ORDER — DIPHENHYDRAMINE HCL 25 MG PO CAPS: 25.0000 mg | ORAL_CAPSULE | Freq: Once | ORAL | Status: DC

## 2022-04-03 NOTE — Progress Notes (Signed)
Diagnosis: Infusion  Provider:  Marshell Garfinkel MD  Procedure: Infusion  IV Type: Peripheral, IV Location: L Antecubital  Venofer (Iron Sucrose), Dose: 300 mg  Infusion Start Time: 2336  Infusion Stop Time: 1510  Post Infusion IV Care: Peripheral IV Discontinued  Discharge: Condition: Good, Destination: Home . AVS provided to patient.   Performed by:  Adelina Mings, LPN

## 2022-04-10 ENCOUNTER — Ambulatory Visit
Admission: RE | Admit: 2022-04-10 | Discharge: 2022-04-10 | Disposition: A | Discharge status: Home / Self Care | Payer: BC Managed Care – PPO | Source: Ambulatory Visit | Attending: Obstetrics and Gynecology | Admitting: Obstetrics and Gynecology

## 2022-04-10 ENCOUNTER — Other Ambulatory Visit: Payer: BC Managed Care – PPO

## 2022-04-10 DIAGNOSIS — R928 Other abnormal and inconclusive findings on diagnostic imaging of breast: Secondary | ICD-10-CM

## 2022-04-10 DIAGNOSIS — N6489 Other specified disorders of breast: Secondary | ICD-10-CM | POA: Diagnosis not present

## 2022-04-10 DIAGNOSIS — R92332 Mammographic heterogeneous density, left breast: Secondary | ICD-10-CM | POA: Diagnosis not present

## 2022-04-26 DIAGNOSIS — N939 Abnormal uterine and vaginal bleeding, unspecified: Secondary | ICD-10-CM | POA: Diagnosis not present

## 2022-05-24 ENCOUNTER — Other Ambulatory Visit: Payer: BC Managed Care – PPO

## 2022-06-05 DIAGNOSIS — D649 Anemia, unspecified: Secondary | ICD-10-CM | POA: Diagnosis not present

## 2022-06-05 DIAGNOSIS — E221 Hyperprolactinemia: Secondary | ICD-10-CM | POA: Diagnosis not present

## 2022-06-05 DIAGNOSIS — N939 Abnormal uterine and vaginal bleeding, unspecified: Secondary | ICD-10-CM | POA: Diagnosis not present

## 2022-06-05 DIAGNOSIS — D259 Leiomyoma of uterus, unspecified: Secondary | ICD-10-CM | POA: Diagnosis not present

## 2022-06-28 ENCOUNTER — Ambulatory Visit: Payer: BC Managed Care – PPO

## 2023-06-13 DIAGNOSIS — D259 Leiomyoma of uterus, unspecified: Secondary | ICD-10-CM | POA: Diagnosis not present

## 2023-06-13 DIAGNOSIS — N921 Excessive and frequent menstruation with irregular cycle: Secondary | ICD-10-CM | POA: Diagnosis not present

## 2023-06-25 DIAGNOSIS — N921 Excessive and frequent menstruation with irregular cycle: Secondary | ICD-10-CM | POA: Diagnosis not present

## 2023-06-25 DIAGNOSIS — D251 Intramural leiomyoma of uterus: Secondary | ICD-10-CM | POA: Diagnosis not present

## 2023-08-16 DIAGNOSIS — Z01419 Encounter for gynecological examination (general) (routine) without abnormal findings: Secondary | ICD-10-CM | POA: Diagnosis not present

## 2023-08-16 DIAGNOSIS — Z6822 Body mass index (BMI) 22.0-22.9, adult: Secondary | ICD-10-CM | POA: Diagnosis not present

## 2023-11-07 ENCOUNTER — Other Ambulatory Visit: Payer: Self-pay | Admitting: Interventional Radiology

## 2023-11-07 ENCOUNTER — Other Ambulatory Visit: Payer: Self-pay | Admitting: Obstetrics and Gynecology

## 2023-11-07 DIAGNOSIS — D259 Leiomyoma of uterus, unspecified: Secondary | ICD-10-CM

## 2023-11-15 ENCOUNTER — Ambulatory Visit (HOSPITAL_COMMUNITY)
Admission: RE | Admit: 2023-11-15 | Discharge: 2023-11-15 | Disposition: A | Discharge status: Home / Self Care | Source: Ambulatory Visit | Attending: Interventional Radiology | Admitting: Interventional Radiology

## 2023-11-15 DIAGNOSIS — D259 Leiomyoma of uterus, unspecified: Secondary | ICD-10-CM

## 2023-11-29 ENCOUNTER — Encounter (HOSPITAL_COMMUNITY): Payer: Self-pay

## 2023-11-29 ENCOUNTER — Ambulatory Visit (HOSPITAL_COMMUNITY): Attending: Interventional Radiology
